# Patient Record
Sex: Female | Born: 1965 | Race: Black or African American | Hispanic: No | Smoking: Never smoker
Health system: Southern US, Community
[De-identification: ages and names within clinical notes are randomized; demographics above are authoritative.]

## PROBLEM LIST (undated history)

## (undated) DIAGNOSIS — E119 Type 2 diabetes mellitus without complications: Secondary | ICD-10-CM

## (undated) DIAGNOSIS — K219 Gastro-esophageal reflux disease without esophagitis: Secondary | ICD-10-CM

## (undated) DIAGNOSIS — I2510 Atherosclerotic heart disease of native coronary artery without angina pectoris: Secondary | ICD-10-CM

## (undated) DIAGNOSIS — E785 Hyperlipidemia, unspecified: Secondary | ICD-10-CM

## (undated) DIAGNOSIS — F32A Depression, unspecified: Secondary | ICD-10-CM

## (undated) DIAGNOSIS — I219 Acute myocardial infarction, unspecified: Secondary | ICD-10-CM

## (undated) DIAGNOSIS — I509 Heart failure, unspecified: Secondary | ICD-10-CM

## (undated) DIAGNOSIS — I10 Essential (primary) hypertension: Secondary | ICD-10-CM

## (undated) HISTORY — PX: APPENDECTOMY: SHX54

## (undated) HISTORY — DX: Atherosclerotic heart disease of native coronary artery without angina pectoris: I25.10

## (undated) HISTORY — PX: OVARIAN CYST REMOVAL: SHX89

## (undated) HISTORY — DX: Gastro-esophageal reflux disease without esophagitis: K21.9

## (undated) HISTORY — DX: Heart failure, unspecified: I50.9

## (undated) HISTORY — PX: ABDOMINAL HYSTERECTOMY: SHX81

## (undated) HISTORY — PX: CORONARY ARTERY BYPASS GRAFT: SHX141

## (undated) HISTORY — PX: MYOMECTOMY: SHX85

---

## 2010-01-04 ENCOUNTER — Emergency Department: Payer: Self-pay | Admitting: Emergency Medicine

## 2014-03-01 ENCOUNTER — Ambulatory Visit: Payer: Self-pay | Admitting: Orthopedic Surgery

## 2014-09-12 ENCOUNTER — Other Ambulatory Visit: Payer: Self-pay | Admitting: Obstetrics & Gynecology

## 2014-09-12 DIAGNOSIS — Z1231 Encounter for screening mammogram for malignant neoplasm of breast: Secondary | ICD-10-CM

## 2014-09-13 ENCOUNTER — Ambulatory Visit
Admission: RE | Admit: 2014-09-13 | Discharge: 2014-09-13 | Disposition: A | Discharge status: Home / Self Care | Payer: BLUE CROSS/BLUE SHIELD | Source: Ambulatory Visit | Attending: Obstetrics & Gynecology | Admitting: Obstetrics & Gynecology

## 2014-09-13 DIAGNOSIS — Z1231 Encounter for screening mammogram for malignant neoplasm of breast: Secondary | ICD-10-CM | POA: Diagnosis not present

## 2016-08-29 ENCOUNTER — Other Ambulatory Visit: Payer: Self-pay | Admitting: Family Medicine

## 2016-08-29 DIAGNOSIS — Z1231 Encounter for screening mammogram for malignant neoplasm of breast: Secondary | ICD-10-CM

## 2016-09-12 ENCOUNTER — Ambulatory Visit
Admission: RE | Admit: 2016-09-12 | Discharge: 2016-09-12 | Disposition: A | Discharge status: Home / Self Care | Payer: BLUE CROSS/BLUE SHIELD | Source: Ambulatory Visit | Attending: Family Medicine | Admitting: Family Medicine

## 2016-09-12 DIAGNOSIS — Z1231 Encounter for screening mammogram for malignant neoplasm of breast: Secondary | ICD-10-CM | POA: Insufficient documentation

## 2017-01-25 ENCOUNTER — Inpatient Hospital Stay
Admission: EM | Admit: 2017-01-25 | Discharge: 2017-01-30 | DRG: 246 | Disposition: A | Discharge status: Home / Self Care | Payer: BLUE CROSS/BLUE SHIELD | Attending: Internal Medicine | Admitting: Internal Medicine

## 2017-01-25 ENCOUNTER — Encounter
Admission: EM | Disposition: A | Discharge status: Home / Self Care | Payer: Self-pay | Source: Home / Self Care | Attending: Internal Medicine

## 2017-01-25 ENCOUNTER — Other Ambulatory Visit: Payer: Self-pay

## 2017-01-25 ENCOUNTER — Emergency Department: Payer: BLUE CROSS/BLUE SHIELD

## 2017-01-25 DIAGNOSIS — Z8249 Family history of ischemic heart disease and other diseases of the circulatory system: Secondary | ICD-10-CM

## 2017-01-25 DIAGNOSIS — E8770 Fluid overload, unspecified: Secondary | ICD-10-CM | POA: Diagnosis not present

## 2017-01-25 DIAGNOSIS — I2510 Atherosclerotic heart disease of native coronary artery without angina pectoris: Secondary | ICD-10-CM | POA: Diagnosis not present

## 2017-01-25 DIAGNOSIS — R9431 Abnormal electrocardiogram [ECG] [EKG]: Secondary | ICD-10-CM | POA: Diagnosis not present

## 2017-01-25 DIAGNOSIS — I2121 ST elevation (STEMI) myocardial infarction involving left circumflex coronary artery: Principal | ICD-10-CM

## 2017-01-25 DIAGNOSIS — R0602 Shortness of breath: Secondary | ICD-10-CM

## 2017-01-25 DIAGNOSIS — J209 Acute bronchitis, unspecified: Secondary | ICD-10-CM | POA: Diagnosis present

## 2017-01-25 DIAGNOSIS — R079 Chest pain, unspecified: Secondary | ICD-10-CM

## 2017-01-25 DIAGNOSIS — I213 ST elevation (STEMI) myocardial infarction of unspecified site: Secondary | ICD-10-CM | POA: Diagnosis not present

## 2017-01-25 DIAGNOSIS — I471 Supraventricular tachycardia: Secondary | ICD-10-CM | POA: Diagnosis present

## 2017-01-25 DIAGNOSIS — J9600 Acute respiratory failure, unspecified whether with hypoxia or hypercapnia: Secondary | ICD-10-CM | POA: Diagnosis not present

## 2017-01-25 DIAGNOSIS — E785 Hyperlipidemia, unspecified: Secondary | ICD-10-CM

## 2017-01-25 DIAGNOSIS — I25110 Atherosclerotic heart disease of native coronary artery with unstable angina pectoris: Secondary | ICD-10-CM | POA: Diagnosis present

## 2017-01-25 DIAGNOSIS — Z7982 Long term (current) use of aspirin: Secondary | ICD-10-CM

## 2017-01-25 DIAGNOSIS — R7301 Impaired fasting glucose: Secondary | ICD-10-CM | POA: Diagnosis present

## 2017-01-25 DIAGNOSIS — R001 Bradycardia, unspecified: Secondary | ICD-10-CM | POA: Diagnosis present

## 2017-01-25 DIAGNOSIS — I959 Hypotension, unspecified: Secondary | ICD-10-CM | POA: Diagnosis not present

## 2017-01-25 DIAGNOSIS — R059 Cough, unspecified: Secondary | ICD-10-CM

## 2017-01-25 DIAGNOSIS — Z79899 Other long term (current) drug therapy: Secondary | ICD-10-CM

## 2017-01-25 DIAGNOSIS — R05 Cough: Secondary | ICD-10-CM

## 2017-01-25 DIAGNOSIS — I10 Essential (primary) hypertension: Secondary | ICD-10-CM

## 2017-01-25 DIAGNOSIS — I200 Unstable angina: Secondary | ICD-10-CM

## 2017-01-25 HISTORY — DX: Hyperlipidemia, unspecified: E78.5

## 2017-01-25 HISTORY — PX: INTRAVASCULAR ULTRASOUND/IVUS: CATH118244

## 2017-01-25 HISTORY — PX: LEFT HEART CATH AND CORONARY ANGIOGRAPHY: CATH118249

## 2017-01-25 HISTORY — PX: CORONARY/GRAFT ACUTE MI REVASCULARIZATION: CATH118305

## 2017-01-25 HISTORY — DX: Essential (primary) hypertension: I10

## 2017-01-25 LAB — BASIC METABOLIC PANEL
Anion gap: 8 (ref 5–15)
BUN: 10 mg/dL (ref 6–20)
CALCIUM: 10.5 mg/dL — AB (ref 8.9–10.3)
CHLORIDE: 105 mmol/L (ref 101–111)
CO2: 24 mmol/L (ref 22–32)
CREATININE: 0.8 mg/dL (ref 0.44–1.00)
GFR calc non Af Amer: >60 mL/min (ref >60)
GLUCOSE: 138 mg/dL — AB (ref 65–99)
Potassium: 3.8 mmol/L (ref 3.5–5.1)
Sodium: 137 mmol/L (ref 135–145)

## 2017-01-25 LAB — CBC
HCT: 38.8 % (ref 35.0–47.0)
Hemoglobin: 12.5 g/dL (ref 12.0–16.0)
MCH: 25.6 pg — AB (ref 26.0–34.0)
MCHC: 32.3 g/dL (ref 32.0–36.0)
MCV: 79.1 fL — AB (ref 80.0–100.0)
PLATELETS: 245 10*3/uL (ref 150–440)
RBC: 4.9 MIL/uL (ref 3.80–5.20)
RDW: 14.4 % (ref 11.5–14.5)
WBC: 7.7 10*3/uL (ref 3.6–11.0)

## 2017-01-25 LAB — TROPONIN I
TROPONIN I: 0.14 ng/mL — AB (ref <0.03)
TROPONIN I: 0.55 ng/mL — AB (ref <0.03)

## 2017-01-25 LAB — PROTIME-INR
INR: 1
Prothrombin Time: 13.1 seconds (ref 11.4–15.2)

## 2017-01-25 LAB — HEPARIN LEVEL (UNFRACTIONATED): Heparin Unfractionated: 0.44 IU/mL (ref 0.30–0.70)

## 2017-01-25 LAB — GLUCOSE, CAPILLARY
GLUCOSE-CAPILLARY: 113 mg/dL — AB (ref 65–99)
Glucose-Capillary: 115 mg/dL — ABNORMAL HIGH (ref 65–99)

## 2017-01-25 LAB — APTT: aPTT: 30 seconds (ref 24–36)

## 2017-01-25 LAB — HCG, QUANTITATIVE, PREGNANCY: hCG, Beta Chain, Quant, S: 1 m[IU]/mL (ref <5)

## 2017-01-25 SURGERY — LEFT HEART CATH AND CORONARY ANGIOGRAPHY
Anesthesia: Moderate Sedation

## 2017-01-25 SURGERY — CORONARY/GRAFT ACUTE MI REVASCULARIZATION

## 2017-01-25 MED ORDER — TICAGRELOR 90 MG PO TABS
ORAL_TABLET | ORAL | Status: DC | PRN
  Administered 2017-01-25: 180 mg via ORAL

## 2017-01-25 MED ORDER — ROSUVASTATIN CALCIUM 10 MG PO TABS
20.0000 mg | ORAL_TABLET | Freq: Every evening | ORAL | Status: DC
  Administered 2017-01-25: 20 mg via ORAL
  Filled 2017-01-25: qty 2

## 2017-01-25 MED ORDER — HEPARIN SODIUM (PORCINE) 5000 UNIT/ML IJ SOLN: 60.0000 [IU]/kg | Freq: Once | INTRAMUSCULAR | Status: DC

## 2017-01-25 MED ORDER — ASPIRIN 81 MG PO CHEW
324.0000 mg | CHEWABLE_TABLET | Freq: Once | ORAL | Status: AC
  Administered 2017-01-25: 324 mg via ORAL

## 2017-01-25 MED ORDER — TIROFIBAN HCL IN NACL 5-0.9 MG/100ML-% IV SOLN
INTRAVENOUS | Status: AC | PRN
  Administered 2017-01-25: 0.15 ug/kg/min via INTRAVENOUS

## 2017-01-25 MED ORDER — HEPARIN SODIUM (PORCINE) 1000 UNIT/ML IJ SOLN
INTRAMUSCULAR | Status: AC
  Filled 2017-01-25: qty 1

## 2017-01-25 MED ORDER — NOREPINEPHRINE BITARTRATE 1 MG/ML IV SOLN
INTRAVENOUS | Status: AC
  Filled 2017-01-25: qty 4

## 2017-01-25 MED ORDER — HEPARIN (PORCINE) IN NACL 100-0.45 UNIT/ML-% IJ SOLN
750.0000 [IU]/h | INTRAMUSCULAR | Status: DC
  Administered 2017-01-25: 750 [IU]/h via INTRAVENOUS
  Filled 2017-01-25: qty 250

## 2017-01-25 MED ORDER — NITROGLYCERIN 0.4 MG SL SUBL
0.4000 mg | SUBLINGUAL_TABLET | SUBLINGUAL | Status: DC | PRN
  Administered 2017-01-25: 0.4 mg via SUBLINGUAL
  Filled 2017-01-25: qty 1

## 2017-01-25 MED ORDER — HEPARIN SODIUM (PORCINE) 1000 UNIT/ML IJ SOLN
INTRAMUSCULAR | Status: DC | PRN
  Administered 2017-01-25: 4000 [IU] via INTRAVENOUS
  Administered 2017-01-25: 6000 [IU] via INTRAVENOUS

## 2017-01-25 MED ORDER — ASPIRIN 81 MG PO CHEW: 324.0000 mg | CHEWABLE_TABLET | Freq: Once | ORAL | Status: DC

## 2017-01-25 MED ORDER — ASPIRIN EC 81 MG PO TBEC
81.0000 mg | DELAYED_RELEASE_TABLET | Freq: Every day | ORAL | Status: DC
  Administered 2017-01-26 – 2017-01-30 (×5): 81 mg via ORAL
  Filled 2017-01-25 (×6): qty 1

## 2017-01-25 MED ORDER — BISACODYL 10 MG RE SUPP: 10.0000 mg | Freq: Every day | RECTAL | Status: DC | PRN

## 2017-01-25 MED ORDER — VERAPAMIL HCL 2.5 MG/ML IV SOLN
INTRAVENOUS | Status: AC
  Filled 2017-01-25: qty 2

## 2017-01-25 MED ORDER — PANTOPRAZOLE SODIUM 40 MG IV SOLR
40.0000 mg | Freq: Two times a day (BID) | INTRAVENOUS | Status: DC
  Administered 2017-01-25: 40 mg via INTRAVENOUS
  Filled 2017-01-25: qty 40

## 2017-01-25 MED ORDER — SODIUM CHLORIDE 0.9 % IV SOLN: INTRAVENOUS | Status: DC

## 2017-01-25 MED ORDER — NITROGLYCERIN 2 % TD OINT
1.0000 [in_us] | TOPICAL_OINTMENT | Freq: Four times a day (QID) | TRANSDERMAL | Status: DC
  Administered 2017-01-25: 1 [in_us] via TOPICAL
  Filled 2017-01-25 (×2): qty 1

## 2017-01-25 MED ORDER — NITROGLYCERIN 0.4 MG SL SUBL
0.4000 mg | SUBLINGUAL_TABLET | SUBLINGUAL | Status: DC | PRN
Start: 2017-01-25 — End: 2017-01-25
  Administered 2017-01-25: 0.4 mg via SUBLINGUAL
  Filled 2017-01-25: qty 1

## 2017-01-25 MED ORDER — ASPIRIN 81 MG PO CHEW: 81.0000 mg | CHEWABLE_TABLET | ORAL | Status: DC

## 2017-01-25 MED ORDER — HEPARIN BOLUS VIA INFUSION
3700.0000 [IU] | Freq: Once | INTRAVENOUS | Status: AC
  Administered 2017-01-25: 3700 [IU] via INTRAVENOUS
  Filled 2017-01-25: qty 3700

## 2017-01-25 MED ORDER — TIROFIBAN HCL IN NACL 5-0.9 MG/100ML-% IV SOLN
INTRAVENOUS | Status: AC
  Filled 2017-01-25: qty 100

## 2017-01-25 MED ORDER — ASPIRIN 81 MG PO CHEW
CHEWABLE_TABLET | ORAL | Status: AC
  Administered 2017-01-25: 324 mg via ORAL
  Filled 2017-01-25: qty 4

## 2017-01-25 MED ORDER — NITROGLYCERIN IN D5W 200-5 MCG/ML-% IV SOLN
0.0000 ug/min | INTRAVENOUS | Status: DC
  Administered 2017-01-25: 5 ug/min via INTRAVENOUS
  Administered 2017-01-25: 25 ug/min via INTRAVENOUS
  Administered 2017-01-25: 10 ug/min via INTRAVENOUS
  Administered 2017-01-25: 15 ug/min via INTRAVENOUS
  Administered 2017-01-25: 20 ug/min via INTRAVENOUS
  Filled 2017-01-25: qty 250

## 2017-01-25 MED ORDER — LORATADINE 10 MG PO TABS
10.0000 mg | ORAL_TABLET | Freq: Every day | ORAL | Status: DC
  Administered 2017-01-26 – 2017-01-30 (×5): 10 mg via ORAL
  Filled 2017-01-25 (×5): qty 1

## 2017-01-25 MED ORDER — SODIUM CHLORIDE 0.9 % IV SOLN
INTRAVENOUS | Status: DC
  Administered 2017-01-25: 22:00:00 via INTRAVENOUS

## 2017-01-25 MED ORDER — NITROGLYCERIN 2 % TD OINT
1.0000 [in_us] | TOPICAL_OINTMENT | Freq: Once | TRANSDERMAL | Status: AC
  Administered 2017-01-25: 1 [in_us] via TOPICAL
  Filled 2017-01-25: qty 1

## 2017-01-25 MED ORDER — FENTANYL CITRATE (PF) 100 MCG/2ML IJ SOLN
50.0000 ug | INTRAMUSCULAR | Status: DC | PRN
Start: 2017-01-25 — End: 2017-01-25
  Administered 2017-01-25: 50 ug via INTRAVENOUS
  Filled 2017-01-25: qty 2

## 2017-01-25 MED ORDER — DOCUSATE SODIUM 100 MG PO CAPS
100.0000 mg | ORAL_CAPSULE | Freq: Two times a day (BID) | ORAL | Status: DC
  Administered 2017-01-26 – 2017-01-30 (×9): 100 mg via ORAL
  Filled 2017-01-25 (×9): qty 1

## 2017-01-25 MED ORDER — TIROFIBAN (AGGRASTAT) BOLUS VIA INFUSION
INTRAVENOUS | Status: DC | PRN
  Administered 2017-01-25: 1615 ug via INTRAVENOUS

## 2017-01-25 MED ORDER — MORPHINE SULFATE (PF) 2 MG/ML IV SOLN
2.0000 mg | INTRAVENOUS | Status: DC | PRN
  Administered 2017-01-25: 2 mg via INTRAVENOUS
  Filled 2017-01-25: qty 1

## 2017-01-25 MED ORDER — SODIUM CHLORIDE 0.9 % IV BOLUS (SEPSIS)
500.0000 mL | Freq: Once | INTRAVENOUS | Status: AC
  Administered 2017-01-25: 500 mL via INTRAVENOUS

## 2017-01-25 MED ORDER — ONDANSETRON HCL 4 MG PO TABS: 4.0000 mg | ORAL_TABLET | Freq: Four times a day (QID) | ORAL | Status: DC | PRN

## 2017-01-25 MED ORDER — ONDANSETRON HCL 4 MG/2ML IJ SOLN
4.0000 mg | Freq: Four times a day (QID) | INTRAMUSCULAR | Status: DC | PRN
  Administered 2017-01-25 – 2017-01-26 (×3): 4 mg via INTRAVENOUS
  Filled 2017-01-25 (×4): qty 2

## 2017-01-25 MED ORDER — TICAGRELOR 90 MG PO TABS
ORAL_TABLET | ORAL | Status: AC
  Filled 2017-01-25: qty 2

## 2017-01-25 MED ORDER — ALUM & MAG HYDROXIDE-SIMETH 200-200-20 MG/5ML PO SUSP
30.0000 mL | ORAL | Status: DC | PRN
  Filled 2017-01-25: qty 30

## 2017-01-25 MED ORDER — METOPROLOL TARTRATE 25 MG PO TABS
25.0000 mg | ORAL_TABLET | Freq: Two times a day (BID) | ORAL | Status: DC
  Administered 2017-01-25: 25 mg via ORAL
  Filled 2017-01-25: qty 1

## 2017-01-25 MED ORDER — NITROGLYCERIN 5 MG/ML IV SOLN
INTRAVENOUS | Status: AC
  Filled 2017-01-25: qty 10

## 2017-01-25 MED ORDER — INSULIN ASPART 100 UNIT/ML ~~LOC~~ SOLN
0.0000 [IU] | Freq: Three times a day (TID) | SUBCUTANEOUS | Status: DC
  Administered 2017-01-26 – 2017-01-28 (×6): 2 [IU] via SUBCUTANEOUS
  Administered 2017-01-28 – 2017-01-29 (×4): 1 [IU] via SUBCUTANEOUS
  Filled 2017-01-25 (×10): qty 1

## 2017-01-25 MED ORDER — ACETAMINOPHEN 325 MG PO TABS
650.0000 mg | ORAL_TABLET | Freq: Four times a day (QID) | ORAL | Status: DC | PRN
  Administered 2017-01-25 – 2017-01-27 (×2): 650 mg via ORAL
  Filled 2017-01-25 (×2): qty 2

## 2017-01-25 MED ORDER — ATROPINE SULFATE 1 MG/10ML IJ SOSY
PREFILLED_SYRINGE | INTRAMUSCULAR | Status: DC | PRN
  Administered 2017-01-25: 0.5 mg via INTRAVENOUS

## 2017-01-25 MED ORDER — NITROGLYCERIN 1 MG/10 ML FOR IR/CATH LAB
INTRA_ARTERIAL | Status: DC | PRN
  Administered 2017-01-25 (×2): 100 ug via INTRACORONARY

## 2017-01-25 MED ORDER — LOSARTAN POTASSIUM 50 MG PO TABS: 50.0000 mg | ORAL_TABLET | Freq: Every day | ORAL | Status: DC

## 2017-01-25 MED ORDER — ACETAMINOPHEN 650 MG RE SUPP: 650.0000 mg | Freq: Four times a day (QID) | RECTAL | Status: DC | PRN

## 2017-01-25 MED ORDER — ATROPINE SULFATE 1 MG/10ML IJ SOSY
PREFILLED_SYRINGE | INTRAMUSCULAR | Status: AC
  Filled 2017-01-25: qty 10

## 2017-01-25 SURGICAL SUPPLY — 24 items
BALLN MINITREK RX 2.0X12 (BALLOONS) ×3
BALLN ~~LOC~~ EUPHORA RX 3.0X15 (BALLOONS) ×3
BALLN ~~LOC~~ TREK RX 3.5X8 (BALLOONS) ×3
BALLOON MINITREK RX 2.0X12 (BALLOONS) ×1 IMPLANT
BALLOON ~~LOC~~ EUPHORA RX 3.0X15 (BALLOONS) ×1 IMPLANT
BALLOON ~~LOC~~ TREK RX 3.5X8 (BALLOONS) ×1 IMPLANT
CANNULA 5F STIFF (CANNULA) ×3 IMPLANT
CATH EAGLE EYE PLAT IMAGING (CATHETERS) ×3 IMPLANT
CATH INFINITI 5FR AL1 (CATHETERS) ×3 IMPLANT
CATH INFINITI 5FR ANG PIGTAIL (CATHETERS) ×3 IMPLANT
CATH INFINITI JR4 5F (CATHETERS) ×3 IMPLANT
CATH VISTA GUIDE 6FR JR4 (CATHETERS) IMPLANT
CATH VISTA GUIDE 6FR XB3.5 (CATHETERS) ×3 IMPLANT
DEVICE CLOSURE MYNXGRIP 6/7F (Vascular Products) ×3 IMPLANT
DEVICE INFLAT 30 PLUS (MISCELLANEOUS) ×3 IMPLANT
GLIDESHEATH SLEND SS 6F .021 (SHEATH) IMPLANT
GUIDEWIRE 3MM J TIP .035 145 (WIRE) ×3 IMPLANT
KIT MANI 3VAL PERCEP (MISCELLANEOUS) ×3 IMPLANT
NEEDLE PERC 18GX7CM (NEEDLE) IMPLANT
PACK CARDIAC CATH (CUSTOM PROCEDURE TRAY) ×3 IMPLANT
SHEATH AVANTI 6FR X 11CM (SHEATH) ×3 IMPLANT
STENT SIERRA 2.50 X 23 MM (Permanent Stent) ×3 IMPLANT
WIRE ROSEN-J .035X260CM (WIRE) IMPLANT
WIRE RUNTHROUGH .014X180CM (WIRE) ×3 IMPLANT

## 2017-01-25 NOTE — H&P (Signed)
History and Physical    Jenna Boyer HYW:[id number] DOB: November 21, 1965 DOA: 01/25/2017  Referring physician: Dr. Quentin Cornwall PCP: No primary care provider on file.  Specialists: none  Chief Complaint: chest pain  HPI: Jenna Boyer is a 51 y.o. female has a past medical history significant for HTN and HLD  Now with new onset CP and SOB with minimal exertion. EKG abnormal in ER. Pain relieved with nitrates. 1st troponin normal. She is now admitted. Denies cardiac hx. No FH of CAD. Does not smoke. Not on OCP's. Currently pain-free  Review of Systems: The patient denies anorexia, fever, weight loss,, vision loss, decreased hearing, hoarseness,  syncope, peripheral edema, balance deficits, hemoptysis, abdominal pain, melena, hematochezia, severe indigestion/heartburn, hematuria, incontinence, genital sores, muscle weakness, suspicious skin lesions, transient blindness, difficulty walking, depression, unusual weight change, abnormal bleeding, enlarged lymph nodes, angioedema, and breast masses.   Past Medical History:  Diagnosis Date  . Hyperlipemia   . Hypertension    Past Surgical History:  Procedure Laterality Date  . ABDOMINAL HYSTERECTOMY    . APPENDECTOMY     Social History:  reports that  has never smoked. she has never used smokeless tobacco. She reports that she does not drink alcohol or use drugs.  No Known Allergies  Family History  Problem Relation Age of Onset  . Breast cancer Maternal Grandmother     Prior to Admission medications   Medication Sig Start Date End Date Taking? Authorizing Provider  aspirin EC 81 MG tablet Take 81 mg by mouth daily.   Yes [provider]  fexofenadine (ALLEGRA) 180 MG tablet Take 180 mg by mouth daily.   Yes [provider]  losartan (COZAAR) 100 MG tablet Take 100 mg by mouth daily. 12/06/16  Yes [provider]  rosuvastatin (CRESTOR) 20 MG tablet Take 20 mg by mouth every evening. 12/22/16  Yes [provider]   Physical Exam: Vitals:   01/25/17 1241 01/25/17 1242  Pulse: (!) 102   Resp: 18   Temp: 98 F (36.7 C)   TempSrc: Oral   SpO2: 100%   Weight:  65.8 kg (145 lb)  Height:  5\' 1"  (1.549 m)     General:  No apparent distress, WDWN, [place]/AT  Eyes: PERRL, EOMI, no scleral icterus, conjunctiva clear  ENT: moist oropharynx without exudate, TM's benign, dentition good  Neck: supple, no lymphadenopathy. No bruits or thyromegaly  Cardiovascular: regular rate without MRG; 2+ peripheral pulses, no JVD, no peripheral edema  Respiratory: CTA biL, good air movement without wheezing, rhonchi or crackled. Respiratory effort normal  Abdomen: soft, non tender to palpation, positive bowel sounds, no guarding, no rebound  Skin: no rashes or lesions  Musculoskeletal: normal bulk and tone, no joint swelling  Psychiatric: normal mood and affect, A&OX3  Neurologic: CN 2-12 grossly intact, Motor strength 5/5 in all 4 groups with symmetric DTR's and non-focal sensory exam  Labs on Admission:  Basic Metabolic Panel: Recent Labs  Lab 01/25/17 1242  NA 137  K 3.8  CL 105  CO2 24  GLUCOSE 138*  BUN 10  CREATININE 0.80  CALCIUM 10.5*   Liver Function Tests: No results for input(s): AST, ALT, ALKPHOS, BILITOT, PROT, ALBUMIN in the last 168 hours. No results for input(s): LIPASE, AMYLASE in the last 168 hours. No results for input(s): AMMONIA in the last 168 hours. CBC: Recent Labs  Lab 01/25/17 1242  WBC 7.7  HGB 12.5  HCT 38.8  MCV 79.1*  PLT 245  Cardiac Enzymes: Recent Labs  Lab 01/25/17 1242  TROPONINI <0.03    BNP (last 3 results) No results for input(s): BNP in the last 8760 hours.  ProBNP (last 3 results) No results for input(s): PROBNP in the last 8760 hours.  CBG: No results for input(s): GLUCAP in the last 168 hours.  Radiological Exams on Admission: Dg Chest Portable 1 View  Result Date: 01/25/2017 CLINICAL DATA:  Chest pain and shortness  of Breath EXAM: PORTABLE CHEST 1 VIEW COMPARISON:  None. FINDINGS: The heart size and mediastinal contours are within normal limits. Both lungs are clear. The visualized skeletal structures are unremarkable. IMPRESSION: No active disease. Electronically Signed   By: Kerby Moors M.D.   On: 01/25/2017 13:16    EKG: Independently reviewed.  Assessment/Plan Principal Problem:   Unstable angina (HCC) Active Problems:   HTN (hypertension)   HLD (hyperlipidemia)   SOB (shortness of breath)   Will observe on telemetry with IV Heparin and NTP. Follow enzymes. Echo ordered. Consult Cardiology. Repeat labs in AM  Diet: clear liquids Fluids: NS@50  DVT Prophylaxis: IV Heparin  Code Status: FULL  Family Communication: none  Disposition Plan: home  Time spent: 50 min

## 2017-01-25 NOTE — Progress Notes (Signed)
CRITICAL VALUE ALERT  Critical Value:  Troponin 0.14  Date & Time Notied: 01/25/17 1615   Provider Notified: Dr Doy Hutching  Orders Received/Actions taken: none

## 2017-01-25 NOTE — Progress Notes (Signed)
Patient has developed recurrent chest pain with Now st elevation in lateral and reciprocal changes in anterior leads. Spoke to Dr. Jannifer Franklin and agree in starting STEMI protocol. Advise plavix 300 mg now and activate STEMI protocol.Dr. Jannifer Franklin has already contacted Dr.End and was on the phone while I called. I agree with taking to Cath lab now.

## 2017-01-25 NOTE — Progress Notes (Signed)
ANTICOAGULATION CONSULT NOTE - Initial Consult  Pharmacy Consult for heparin Indication: chest pain/ACS  No Known Allergies  Patient Measurements: Height: 5\' 1"  (154.9 cm) Weight: 145 lb (65.8 kg) IBW/kg (Calculated) : 47.8 Heparin Dosing Weight:   Vital Signs: Temp: 98 F (36.7 C) (12/30 1241) Temp Source: Oral (12/30 1241) Pulse Rate: 102 (12/30 1241)  Labs: Recent Labs    01/25/17 1242  HGB 12.5  HCT 38.8  PLT 245  LABPROT 13.1  INR 1.00  CREATININE 0.80  TROPONINI <0.03    Estimated Creatinine Clearance: 73.9 mL/min (by C-G formula based on SCr of 0.8 mg/dL).   Medical History: Past Medical History:  Diagnosis Date  . Hyperlipemia   . Hypertension     Medications:  Infusions:  . sodium chloride    . heparin      Assessment: 51 yof cc chest pain/SOB with exertion. Pharmacy consulted to dose heparin for ACS. RN reports no OAC PTA but we will f/u PTA when available.  Goal of Therapy:  Heparin level 0.3-0.7 units/ml Monitor platelets by anticoagulation protocol: Yes   Plan:  Give 3700 units bolus x 1 Start heparin infusion at 750 units/hr Check anti-Xa level in 6 hours and daily while on heparin Continue to monitor H&H and platelets  Laural Benes, Pharm.D., BCPS Clinical Pharmacist 01/25/2017,1:17 PM

## 2017-01-25 NOTE — Progress Notes (Signed)
51 yo bf admitted to room 245 from ED with Chest pain.  A&O x3, gait steady.  No distress on ra.  Cardiac monitor applied and verified with Salley Scarlet. Pt states no chest pain, just some burning in her epigastric area.  Complaining of headache, Tylenol 650mg  po given, will monitor.  Lungs clear bil.  SL rt ac flushes well.  Heparin gtt infusing lt ac.  Skin intact. Oriented to room and surroundings, POC reviewed with pt.  CB in reach, SR up x 2.

## 2017-01-25 NOTE — ED Triage Notes (Signed)
Pt brought in by POV.  Pt states that she has central chest pain that started in the shower at approx 730 am.  Pt is [facility] with exertion at this time.  Pt is A&Ox4.

## 2017-01-25 NOTE — ED Notes (Signed)
Pt up to restroom at this time

## 2017-01-25 NOTE — Progress Notes (Addendum)
Progress Note  Patient Name: Jenna Boyer Date of Encounter: 01/25/2017  Primary Cardiologist: No primary care provider on file.   Subjective   Patient with recurrent severe substernal chest pain. Repeat EKG this evening with new lateral ST elevation and depressions in anterior leads. Pt seen by Dr. Humphrey Rolls in ED but not immediately available this evening for consultation.  Inpatient Medications    Scheduled Meds: . [START ON 01/26/2017] aspirin  81 mg Oral Pre-Cath  . [START ON 01/26/2017] aspirin EC  81 mg Oral Daily  . docusate sodium  100 mg Oral BID  . insulin aspart  0-9 Units Subcutaneous TID WC  . [START ON 01/26/2017] loratadine  10 mg Oral Daily  . [START ON 01/26/2017] losartan  50 mg Oral Daily  . metoprolol tartrate  25 mg Oral BID  . pantoprazole (PROTONIX) IV  40 mg Intravenous Q12H  . rosuvastatin  20 mg Oral QPM   Continuous Infusions: . sodium chloride 100 mL/hr at 01/25/17 2223  . heparin Stopped (01/25/17 2211)  . nitroGLYCERIN 20 mcg/min (01/25/17 2219)   PRN Meds: acetaminophen **OR** acetaminophen, alum & mag hydroxide-simeth, bisacodyl, morphine injection, ondansetron **OR** ondansetron (ZOFRAN) IV   Vital Signs    Vitals:   01/25/17 2151 01/25/17 2156 01/25/17 2201 01/25/17 2206  BP: 105/63 96/69 104/62 94/65  Pulse: 73 70 74 76  Resp:      Temp:      TempSrc:      SpO2: 100% 100% 100% 100%  Weight:      Height:        Intake/Output Summary (Last 24 hours) at 01/25/2017 2225 Last data filed at 01/25/2017 1853 Gross per 24 hour  Intake 633.13 ml  Output -  Net 633.13 ml   Filed Weights   01/25/17 1242 01/25/17 1513  Weight: 65.8 kg (145 lb) 64.6 kg (142 lb 6.4 oz)    Telemetry    NSR, sinus brady, sinus tach, and PVCs - Personally Reviewed  ECG    Sinus bradycardia with 1 mm ST elevation in I and aVL as well as depressions in V1-V4 - Personally Reviewed  Physical Exam   GEN: Uncomoftable-appearing.  Cardiac: Bradycardiac  but regular; no murmurs, rubs, or gallops.  Respiratory: Clear to auscultation bilaterally. GI: Soft, nontender, non-distended  MS: No edema; No deformity. Neuro:  Nonfocal  Psych: Normal affect   Labs    Chemistry Recent Labs  Lab 01/25/17 1242  NA 137  K 3.8  CL 105  CO2 24  GLUCOSE 138*  BUN 10  CREATININE 0.80  CALCIUM 10.5*  GFRNONAA >60  GFRAA >60  ANIONGAP 8     Hematology Recent Labs  Lab 01/25/17 1242  WBC 7.7  RBC 4.90  HGB 12.5  HCT 38.8  MCV 79.1*  MCH 25.6*  MCHC 32.3  RDW 14.4  PLT 245    Cardiac Enzymes Recent Labs  Lab 01/25/17 1242 01/25/17 1525 01/25/17 2031  TROPONINI <0.03 0.14* 0.55*   No results for input(s): TROPIPOC in the last 168 hours.   BNPNo results for input(s): BNP, PROBNP in the last 168 hours.   DDimer No results for input(s): DDIMER in the last 168 hours.   Radiology    Dg Chest Portable 1 View  Result Date: 01/25/2017 CLINICAL DATA:  Chest pain and shortness of Breath EXAM: PORTABLE CHEST 1 VIEW COMPARISON:  None. FINDINGS: The heart size and mediastinal contours are within normal limits. Both lungs are clear. The visualized skeletal structures  are unremarkable. IMPRESSION: No active disease. Electronically Signed   By: Kerby Moors M.D.   On: 01/25/2017 13:16    Cardiac Studies   None  Patient Profile     51 y.o. female with h/o HTN, admitted with chest pain and rising troponin. She has had recurrent, severe chest pain this evening with dynamic EKG changes now showing findings consistent with posterolateral STEMI.  Assessment & Plan    STEMI Recurrent chest pain this evening with dynamic EKG changes. CP refractory to initiation of NTG infusion.  Plan for emergent LHC with possible PCI tonight. I have reviewed the risks, indications, and alternatives to cardiac catheterization, possible angioplasty, and stenting with the patient. Risks include but are not limited to bleeding, infection, vascular injury,  stroke, myocardial infection, arrhythmia, kidney injury, radiation-related injury in the case of prolonged fluoroscopy use, emergency cardiac surgery, and death. The patient understands the risks of serious complication is 1-2 in 3976 with diagnostic cardiac cath and 1-2% or less with angioplasty/stenting.  Further recommendations to be made following catheterization.  Critical care time: ~40 minutes were spent assessing the patient, reviewing her records, and coordinating emergent cardiac catheterization.  For questions or updates, please contact [place] Please consult [email or web address] for contact info under Cardiology/STEMI.      Signed, Nelva Bush, MD  01/25/2017, 10:25 PM

## 2017-01-25 NOTE — Progress Notes (Addendum)
Called by nursing to assess patient for recurrence of chest pain.  Patient admitted for ACS and on heparin gtt with cath scheduled for tomorrow AM.  Tonight developed recurrent substernal chest pressure/burning with ST elevation in lead I and ST depression in leads V1-V3.  Starting nitro gtt, though patient's BP is low.  Will run fluid bolus concurrently to elevate BP to support for nitro gtt.  Attempting to contact Dr Humphrey Rolls from cardiology who saw the patient today.  Will rpreat EKG after nitro gtt is initiated.  If unable to contact Dr Humphrey Rolls will call on call STEMI cardiologist for evaluation for potential catheterization tonight.  Spoke with Dr Humphrey Rolls who agrees upon reviewing EKG that patient has dynamic changes consistent with STEMI.  Spoke with Dr End, on call for STEMI, and he agreed to proceed with emergent catheterization.  Jacqulyn Bath [facility] 01/25/2017, 9:29 PM

## 2017-01-25 NOTE — Consult Note (Signed)
Jenna Boyer is a 51 y.o. female  [id number]  Primary Cardiologist: [place]  Reason for Consultation: ACS  HPI: [id number] presented with chest pain sincce 6AM and decribed as pressure type lasting until 1 PM after IV nito/asp/plavix and heparin given as per reecomended by me in ER. No further chest pain but has some tightness .  Review of Systems: No orthopnea   Past Medical History:  Diagnosis Date  . Hyperlipemia   . Hypertension     Medications Prior to Admission  Medication Sig Dispense Refill  . aspirin EC 81 MG tablet Take 81 mg by mouth daily.    . fexofenadine (ALLEGRA) 180 MG tablet Take 180 mg by mouth daily.    Marland Kitchen losartan (COZAAR) 100 MG tablet Take 100 mg by mouth daily.  3  . rosuvastatin (CRESTOR) 20 MG tablet Take 20 mg by mouth every evening.  3     . [START ON 01/26/2017] aspirin EC  81 mg Oral Daily  . docusate sodium  100 mg Oral BID  . insulin aspart  0-9 Units Subcutaneous TID WC  . [START ON 01/26/2017] loratadine  10 mg Oral Daily  . [START ON 01/26/2017] losartan  50 mg Oral Daily  . metoprolol tartrate  25 mg Oral BID  . nitroGLYCERIN  1 inch Topical Q6H  . pantoprazole (PROTONIX) IV  40 mg Intravenous Q12H  . rosuvastatin  20 mg Oral QPM    Infusions: . sodium chloride    . heparin 750 Units/hr (01/25/17 1345)    No Known Allergies  Social History   Socioeconomic History  . Marital status: Widowed    Spouse name: Not on file  . Number of children: Not on file  . Years of education: Not on file  . Highest education level: Not on file  Social Needs  . Financial resource strain: Not on file  . Food insecurity - worry: Not on file  . Food insecurity - inability: Not on file  . Transportation needs - medical: Not on file  . Transportation needs - non-medical: Not on file  Occupational History  . Not on file  Tobacco Use  . Smoking status: Never Smoker  . Smokeless tobacco: Never Used  Substance and Sexual Activity  . Alcohol  use: No    Frequency: Never  . Drug use: No  . Sexual activity: Not on file  Other Topics Concern  . Not on file  Social History Narrative  . Not on file    Family History  Problem Relation Age of Onset  . Breast cancer Maternal Grandmother     PHYSICAL EXAM: Vitals:   01/25/17 1514 01/25/17 1522  BP:    Pulse:    Resp:    Temp:  98.3 F (36.8 C)  SpO2: 100%      Intake/Output Summary (Last 24 hours) at 01/25/2017 1523 Last data filed at 01/25/2017 1321 Gross per 24 hour  Intake 500 ml  Output -  Net 500 ml    General:  Well appearing. No respiratory difficulty HEENT: normal Neck: supple. no JVD. Carotids 2+ bilat; no bruits. No lymphadenopathy or thryomegaly appreciated. Cor: PMI nondisplaced. Regular rate & rhythm. No rubs, gallops or murmurs. Lungs: clear Abdomen: soft, nontender, nondistended. No hepatosplenomegaly. No bruits or masses. Good bowel sounds. Extremities: no cyanosis, clubbing, rash, edema Neuro: alert & oriented x 3, cranial nerves grossly intact. moves all 4 extremities w/o difficulty. Affect pleasant.  ECG: Sinus tachycardia with inferolateral st depression  Results for orders placed or performed during the hospital encounter of 01/25/17 (from the past 24 hour(s))  Basic metabolic panel     Status: Abnormal   Collection Time: 01/25/17 12:42 PM  Result Value Ref Range   Sodium 137 135 - 145 mmol/L   Potassium 3.8 3.5 - 5.1 mmol/L   Chloride 105 101 - 111 mmol/L   CO2 24 22 - 32 mmol/L   Glucose, Bld 138 (H) 65 - 99 mg/dL   BUN 10 6 - 20 mg/dL   Creatinine, Ser 0.80 0.44 - 1.00 mg/dL   Calcium 10.5 (H) 8.9 - 10.3 mg/dL   GFR calc non Af Amer >60 >60 mL/min   GFR calc Af Amer >60 >60 mL/min   Anion gap 8 5 - 15  CBC     Status: Abnormal   Collection Time: 01/25/17 12:42 PM  Result Value Ref Range   WBC 7.7 3.6 - 11.0 K/uL   RBC 4.90 3.80 - 5.20 MIL/uL   Hemoglobin 12.5 12.0 - 16.0 g/dL   HCT 38.8 35.0 - 47.0 %   MCV 79.1 (L) 80.0 -  100.0 fL   MCH 25.6 (L) 26.0 - 34.0 pg   MCHC 32.3 32.0 - 36.0 g/dL   RDW 14.4 11.5 - 14.5 %   Platelets 245 150 - 440 K/uL  Troponin I     Status: None   Collection Time: 01/25/17 12:42 PM  Result Value Ref Range   Troponin I <0.03 <0.03 ng/mL  Protime-INR (order if Patient is taking Coumadin / Warfarin)     Status: None   Collection Time: 01/25/17 12:42 PM  Result Value Ref Range   Prothrombin Time 13.1 11.4 - 15.2 seconds   INR 1.00   hCG, quantitative, pregnancy     Status: None   Collection Time: 01/25/17 12:42 PM  Result Value Ref Range   hCG, Beta Chain, Quant, S 1 <5 mIU/mL  APTT     Status: None   Collection Time: 01/25/17 12:42 PM  Result Value Ref Range   aPTT 30 24 - 36 seconds   Dg Chest Portable 1 View  Result Date: 01/25/2017 CLINICAL DATA:  Chest pain and shortness of Breath EXAM: PORTABLE CHEST 1 VIEW COMPARISON:  None. FINDINGS: The heart size and mediastinal contours are within normal limits. Both lungs are clear. The visualized skeletal structures are unremarkable. IMPRESSION: No active disease. Electronically Signed   By: Kerby Moors M.D.   On: 01/25/2017 13:16     ASSESSMENT AND PLAN: Acute coronary syndrome with EKG changes sugesstive of ischaemia and first set troponin ok. Advise heparin/nitrates , plavix and asp and scchedualed cardiac cath for AM.  [facility]

## 2017-01-25 NOTE — Plan of Care (Signed)
  Progressing Education: Knowledge of General Education information will improve 01/25/2017 1551 - Progressing by Etheleen Nicks, RN Cardiac: Ability to achieve and maintain adequate cardiovascular perfusion will improve 01/25/2017 1551 - Progressing by Etheleen Nicks, RN Note Remains chest pain free, pt to have heart cath tomorrow   Not Applicable Clinical Measurements: Diagnostic test results will improve 11/55/2080 2233 - Not Applicable by Etheleen Nicks, RN

## 2017-01-25 NOTE — Progress Notes (Signed)
ANTICOAGULATION CONSULT NOTE - Initial Consult  Pharmacy Consult for heparin Indication: chest pain/ACS  No Known Allergies  Patient Measurements: Height: 5\' 1"  (154.9 cm) Weight: 142 lb 6.4 oz (64.6 kg) IBW/kg (Calculated) : 47.8 Heparin Dosing Weight:   Vital Signs: Temp: 98.3 F (36.8 C) (12/30 2044) Temp Source: Oral (12/30 2044) BP: 86/58 (12/30 2127) Pulse Rate: 67 (12/30 2127)  Labs: Recent Labs    01/25/17 1242 01/25/17 1525 01/25/17 2031  HGB 12.5  --   --   HCT 38.8  --   --   PLT 245  --   --   APTT 30  --   --   LABPROT 13.1  --   --   INR 1.00  --   --   HEPARINUNFRC  --   --  0.44  CREATININE 0.80  --   --   TROPONINI <0.03 0.14* 0.55*    Estimated Creatinine Clearance: 73.2 mL/min (by C-G formula based on SCr of 0.8 mg/dL).   Medical History: Past Medical History:  Diagnosis Date  . Hyperlipemia   . Hypertension     Medications:  Infusions:  . heparin 750 Units/hr (01/25/17 1345)  . nitroGLYCERIN 5 mcg/min (01/25/17 2129)    Assessment: 51 yof cc chest pain/SOB with exertion. Pharmacy consulted to dose heparin for ACS. RN reports no OAC PTA but we will f/u PTA when available.  Goal of Therapy:  Heparin level 0.3-0.7 units/ml Monitor platelets by anticoagulation protocol: Yes   Plan:  Give 3700 units bolus x 1 Start heparin infusion at 750 units/hr Check anti-Xa level in 6 hours and daily while on heparin Continue to monitor H&H and platelets  01/25/17 20:31 HL therapeutic x 1. Continue current rate. Will recheck HL in 6 hours.  Laural Benes, Pharm.D., BCPS Clinical Pharmacist 01/25/2017,9:29 PM

## 2017-01-25 NOTE — ED Notes (Signed)
Heparin witnessed by Dorian Pod, RN

## 2017-01-25 NOTE — ED Provider Notes (Signed)
[facility] Emergency Department Provider Note    First MD Initiated Contact with Patient 01/25/17 1255     (approximate)  I have reviewed the triage vital signs and the nursing notes.   HISTORY  Chief Complaint Chest Pain    HPI Aliana Kreischer is a 51 y.o. female with a history of hyperlipidemia as well as hypertension with no previous cardiac history presents to the ER with midsternal initially left chest pain and pressure radiating to the mid chest that started around 730 this morning while the patient was taking a shower.  States she is having exertional chest pain.  Denies any diaphoresis.  No recent fevers.  No nausea or vomiting.  Does have positive family history of heart disease but no personal history.  States she has been quite stressed out recently while caring for her mother who suffers from advanced dementia.  Patient went to church today and started having worsening pain so she was brought to the ER.  Past Medical History:  Diagnosis Date  . Hyperlipemia   . Hypertension    Family History  Problem Relation Age of Onset  . Breast cancer Maternal Grandmother    Past Surgical History:  Procedure Laterality Date  . ABDOMINAL HYSTERECTOMY    . APPENDECTOMY     Patient Active Problem List   Diagnosis Date Noted  . Unstable angina ([place]) 01/25/2017  . HTN (hypertension) 01/25/2017  . HLD (hyperlipidemia) 01/25/2017  . SOB (shortness of breath) 01/25/2017      Prior to Admission medications   Medication Sig Start Date End Date Taking? Authorizing Provider  aspirin EC 81 MG tablet Take 81 mg by mouth daily.   Yes [provider]  fexofenadine (ALLEGRA) 180 MG tablet Take 180 mg by mouth daily.   Yes [provider]  losartan (COZAAR) 100 MG tablet Take 100 mg by mouth daily. 12/06/16  Yes [provider]  rosuvastatin (CRESTOR) 20 MG tablet Take 20 mg by mouth every evening. 12/22/16  Yes [provider]     Allergies Patient has no known allergies.    Social History Social History   Tobacco Use  . Smoking status: Never Smoker  . Smokeless tobacco: Never Used  Substance Use Topics  . Alcohol use: No    Frequency: Never  . Drug use: No    Review of Systems Patient denies headaches, rhinorrhea, blurry vision, numbness, shortness of breath, chest pain, edema, cough, abdominal pain, nausea, vomiting, diarrhea, dysuria, fevers, rashes or hallucinations unless otherwise stated above in HPI. ____________________________________________   PHYSICAL EXAM:  VITAL SIGNS: Vitals:   01/25/17 1241  Pulse: (!) 102  Resp: 18  Temp: 98 F (36.7 C)  SpO2: 100%    Constitutional: Alert and oriented. Well appearing and in no acute distress. Eyes: Conjunctivae are normal.  Head: Atraumatic. Nose: No congestion/rhinnorhea. Mouth/Throat: Mucous membranes are moist.   Neck: No stridor. Painless ROM.  Cardiovascular: Normal rate, regular rhythm. Grossly normal heart sounds.  Good peripheral circulation. Respiratory: Normal respiratory effort.  No retractions. Lungs CTAB. Gastrointestinal: Soft and nontender. No distention. No abdominal bruits. No CVA tenderness. Genitourinary:  Musculoskeletal: No lower extremity tenderness nor edema.  No joint effusions. Neurologic:  Normal speech and language. No gross focal neurologic deficits are appreciated. No facial droop Skin:  Skin is warm, dry and intact. No rash noted. Psychiatric: Mood and affect are normal. Speech and behavior are normal.  ____________________________________________   LABS (all labs ordered are listed, but  only abnormal results are displayed)  Results for orders placed or performed during the hospital encounter of 01/25/17 (from the past 24 hour(s))  Basic metabolic panel     Status: Abnormal   Collection Time: 01/25/17 12:42 PM  Result Value Ref Range   Sodium 137 135 - 145 mmol/L   Potassium 3.8 3.5 - 5.1 mmol/L    Chloride 105 101 - 111 mmol/L   CO2 24 22 - 32 mmol/L   Glucose, Bld 138 (H) 65 - 99 mg/dL   BUN 10 6 - 20 mg/dL   Creatinine, Ser 0.80 0.44 - 1.00 mg/dL   Calcium 10.5 (H) 8.9 - 10.3 mg/dL   GFR calc non Af Amer >60 >60 mL/min   GFR calc Af Amer >60 >60 mL/min   Anion gap 8 5 - 15  CBC     Status: Abnormal   Collection Time: 01/25/17 12:42 PM  Result Value Ref Range   WBC 7.7 3.6 - 11.0 K/uL   RBC 4.90 3.80 - 5.20 MIL/uL   Hemoglobin 12.5 12.0 - 16.0 g/dL   HCT 38.8 35.0 - 47.0 %   MCV 79.1 (L) 80.0 - 100.0 fL   MCH 25.6 (L) 26.0 - 34.0 pg   MCHC 32.3 32.0 - 36.0 g/dL   RDW 14.4 11.5 - 14.5 %   Platelets 245 150 - 440 K/uL  Troponin I     Status: None   Collection Time: 01/25/17 12:42 PM  Result Value Ref Range   Troponin I <0.03 <0.03 ng/mL  Protime-INR (order if Patient is taking Coumadin / Warfarin)     Status: None   Collection Time: 01/25/17 12:42 PM  Result Value Ref Range   Prothrombin Time 13.1 11.4 - 15.2 seconds   INR 1.00    ____________________________________________  EKG My review and personal interpretation at Time: 12:34   Indication: chest pain  Rate: 110  Rhythm: sinus Axis: normal Other: inferolateral st depressions,  1MM elevation in AVR   My review and personal interpretation at Time: 12:43  Indication: chet pain  Rate: 100  Rhythm: sinus Axis: normal Other: persistent inferolateral st depressions   My review and personal interpretation at Time: 13:03   Indication: chet pain  Rate: 115  Rhythm: sinus Axis: normal Other: unchanged  My review and personal interpretation at Time: 13:12   Indication: chest pain  Rate: 107  Rhythm: sinus Axis: normal Other: Posterior leads show no evidence of ST elevation MI.  ____________________________________________  RADIOLOGY  I personally reviewed all radiographic images ordered to evaluate for the above acute complaints and reviewed radiology reports and findings.  These findings were personally  discussed with the patient.  Please see medical record for radiology report.  ____________________________________________   PROCEDURES  Procedure(s) performed:  .Critical Care Performed by: Merlyn Lot, MD Authorized by: Merlyn Lot, MD   Critical care provider statement:    Critical care time (minutes):  30   Critical care time was exclusive of:  Separately billable procedures and treating other patients   Critical care was necessary to treat or prevent imminent or life-threatening deterioration of the following conditions:  Cardiac failure   Critical care was time spent personally by me on the following activities:  Development of treatment plan with patient or surrogate, discussions with consultants, evaluation of patient's response to treatment, examination of patient, obtaining history from patient or surrogate, ordering and performing treatments and interventions, ordering and review of laboratory studies, ordering and review of radiographic studies, pulse  oximetry, re-evaluation of patient's condition and review of old [place] performed: yes ____________________________________________   INITIAL IMPRESSION / ASSESSMENT AND PLAN / ED COURSE  Pertinent labs & imaging results that were available during my care of the patient were reviewed by me and considered in my medical decision making (see chart for details).  DDX: .ddcchest   Kanoelani Dobies is a 51 y.o. who presents to the ED with typical chest pain as described above arriving with EKG changes concerning for inferolateral ischemia and acute MI.  Serial EKGs do show dynamic changes but no ST elevation.  Patient started on IV heparin as well as IV nitroglycerin.  Not clinically consistent with dissection.  No evidence of pulmonary embolism.  Patient given several nitro as well as aspirin.  The patient will be placed on continuous pulse oximetry and telemetry for monitoring.  Laboratory evaluation  will be sent to evaluate for the above complaints.  Initial troponin was negative.  Did have some relief in symptoms after IV pain medication as well as nitroglycerin.  Discussed case with Dr. Yancey Flemings of cardiology.  Patient does not meet criteria for ST elevation MI but EKG changes are concerning for some component of angina versus [place] Subu cardiomyopathy.  Based on her risk factors I do believe patient will require admission the hospital for hemodynamic monitoring and further medical management.  Have discussed with the patient and available family all diagnostics and treatments performed thus far and all questions were answered to the best of my ability. The patient demonstrates understanding and agreement with plan.        ____________________________________________   FINAL CLINICAL IMPRESSION(S) / ED DIAGNOSES  Final diagnoses:  Chest pain, unspecified type  Abnormal EKG      NEW MEDICATIONS STARTED DURING THIS VISIT:  This SmartLink is deprecated. Use AVSMEDLIST instead to display the medication list for a patient.   Note:  This document was prepared using Dragon voice recognition software and may include unintentional dictation errors.    Merlyn Lot, MD 01/25/17 [phone]

## 2017-01-25 NOTE — Progress Notes (Signed)
Patient complaining of 7/10 mid sternal chest pain, states that it feels like intense heart burn. Morphine given, patient reports some initial relief. Patient started to experience some nausea, zofran given. MD paged to report to bedside. EKG and SL NTG given. EKG shows acute MI, with ST Elevation. MD at bedside.   Nitroglycerin gtt initiated with 1L bolus running simultaneously for BP control. Patient still complaining of chest pain. No relief noted from any interventions this far. Code STEMI called. Patient hooked to zoll, heparin gtt stopped. Patient transported to cath lab with NT, and Lelon Frohlich, house supervisor. Report given to cath lab staff. All questions answered. Son, Dion Saucier, updated.

## 2017-01-26 ENCOUNTER — Encounter
Admission: EM | Disposition: A | Discharge status: Home / Self Care | Payer: Self-pay | Source: Home / Self Care | Attending: Internal Medicine

## 2017-01-26 ENCOUNTER — Encounter: Payer: Self-pay | Admitting: Internal Medicine

## 2017-01-26 ENCOUNTER — Inpatient Hospital Stay: Payer: BLUE CROSS/BLUE SHIELD

## 2017-01-26 DIAGNOSIS — R9431 Abnormal electrocardiogram [ECG] [EKG]: Secondary | ICD-10-CM | POA: Diagnosis present

## 2017-01-26 DIAGNOSIS — I25110 Atherosclerotic heart disease of native coronary artery with unstable angina pectoris: Secondary | ICD-10-CM | POA: Diagnosis present

## 2017-01-26 DIAGNOSIS — J9600 Acute respiratory failure, unspecified whether with hypoxia or hypercapnia: Secondary | ICD-10-CM | POA: Diagnosis not present

## 2017-01-26 DIAGNOSIS — R0602 Shortness of breath: Secondary | ICD-10-CM

## 2017-01-26 DIAGNOSIS — I200 Unstable angina: Secondary | ICD-10-CM

## 2017-01-26 DIAGNOSIS — E785 Hyperlipidemia, unspecified: Secondary | ICD-10-CM | POA: Diagnosis present

## 2017-01-26 DIAGNOSIS — I471 Supraventricular tachycardia: Secondary | ICD-10-CM | POA: Diagnosis present

## 2017-01-26 DIAGNOSIS — I2510 Atherosclerotic heart disease of native coronary artery without angina pectoris: Secondary | ICD-10-CM | POA: Diagnosis not present

## 2017-01-26 DIAGNOSIS — R001 Bradycardia, unspecified: Secondary | ICD-10-CM | POA: Diagnosis present

## 2017-01-26 DIAGNOSIS — Z79899 Other long term (current) drug therapy: Secondary | ICD-10-CM | POA: Diagnosis not present

## 2017-01-26 DIAGNOSIS — I959 Hypotension, unspecified: Secondary | ICD-10-CM | POA: Diagnosis not present

## 2017-01-26 DIAGNOSIS — I10 Essential (primary) hypertension: Secondary | ICD-10-CM | POA: Diagnosis present

## 2017-01-26 DIAGNOSIS — I2121 ST elevation (STEMI) myocardial infarction involving left circumflex coronary artery: Principal | ICD-10-CM

## 2017-01-26 DIAGNOSIS — Z8249 Family history of ischemic heart disease and other diseases of the circulatory system: Secondary | ICD-10-CM | POA: Diagnosis not present

## 2017-01-26 DIAGNOSIS — E8770 Fluid overload, unspecified: Secondary | ICD-10-CM | POA: Diagnosis not present

## 2017-01-26 DIAGNOSIS — I213 ST elevation (STEMI) myocardial infarction of unspecified site: Secondary | ICD-10-CM | POA: Diagnosis present

## 2017-01-26 DIAGNOSIS — J209 Acute bronchitis, unspecified: Secondary | ICD-10-CM | POA: Diagnosis present

## 2017-01-26 DIAGNOSIS — R7301 Impaired fasting glucose: Secondary | ICD-10-CM | POA: Diagnosis present

## 2017-01-26 DIAGNOSIS — Z7982 Long term (current) use of aspirin: Secondary | ICD-10-CM | POA: Diagnosis not present

## 2017-01-26 LAB — CBC
HCT: 36.5 % (ref 35.0–47.0)
HEMOGLOBIN: 11.9 g/dL — AB (ref 12.0–16.0)
MCH: 25.9 pg — AB (ref 26.0–34.0)
MCHC: 32.5 g/dL (ref 32.0–36.0)
MCV: 79.7 fL — ABNORMAL LOW (ref 80.0–100.0)
Platelets: 234 10*3/uL (ref 150–440)
RBC: 4.58 MIL/uL (ref 3.80–5.20)
RDW: 14.6 % — AB (ref 11.5–14.5)
WBC: 10.9 10*3/uL (ref 3.6–11.0)

## 2017-01-26 LAB — COMPREHENSIVE METABOLIC PANEL
ALBUMIN: 3.8 g/dL (ref 3.5–5.0)
ALK PHOS: 65 U/L (ref 38–126)
ALT: 29 U/L (ref 14–54)
ANION GAP: 8 (ref 5–15)
AST: 92 U/L — ABNORMAL HIGH (ref 15–41)
BUN: 7 mg/dL (ref 6–20)
CO2: 21 mmol/L — ABNORMAL LOW (ref 22–32)
CREATININE: 0.61 mg/dL (ref 0.44–1.00)
Calcium: 9 mg/dL (ref 8.9–10.3)
Chloride: 109 mmol/L (ref 101–111)
GFR calc non Af Amer: >60 mL/min (ref >60)
GLUCOSE: 189 mg/dL — AB (ref 65–99)
POTASSIUM: 3.6 mmol/L (ref 3.5–5.1)
SODIUM: 138 mmol/L (ref 135–145)
TOTAL PROTEIN: 6.6 g/dL (ref 6.5–8.1)
Total Bilirubin: 1 mg/dL (ref 0.3–1.2)

## 2017-01-26 LAB — GLUCOSE, CAPILLARY
GLUCOSE-CAPILLARY: 162 mg/dL — AB (ref 65–99)
GLUCOSE-CAPILLARY: 168 mg/dL — AB (ref 65–99)
GLUCOSE-CAPILLARY: 192 mg/dL — AB (ref 65–99)
GLUCOSE-CAPILLARY: 200 mg/dL — AB (ref 65–99)
Glucose-Capillary: 170 mg/dL — ABNORMAL HIGH (ref 65–99)

## 2017-01-26 LAB — PHOSPHORUS: PHOSPHORUS: 1.7 mg/dL — AB (ref 2.5–4.6)

## 2017-01-26 LAB — CREATININE, SERUM: CREATININE: 0.62 mg/dL (ref 0.44–1.00)

## 2017-01-26 LAB — TROPONIN I: TROPONIN I: 14.82 ng/mL — AB (ref <0.03)

## 2017-01-26 LAB — HEMOGLOBIN A1C
HEMOGLOBIN A1C: 6.2 % — AB (ref 4.8–5.6)
MEAN PLASMA GLUCOSE: 131.24 mg/dL

## 2017-01-26 LAB — POCT ACTIVATED CLOTTING TIME
ACTIVATED CLOTTING TIME: 219 s
ACTIVATED CLOTTING TIME: 263 s

## 2017-01-26 LAB — MAGNESIUM: MAGNESIUM: 1.6 mg/dL — AB (ref 1.7–2.4)

## 2017-01-26 LAB — MRSA PCR SCREENING: MRSA BY PCR: NEGATIVE

## 2017-01-26 SURGERY — LEFT HEART CATH AND CORONARY ANGIOGRAPHY
Anesthesia: Moderate Sedation

## 2017-01-26 MED ORDER — SODIUM CHLORIDE 0.9 % IV BOLUS (SEPSIS)
1000.0000 mL | Freq: Once | INTRAVENOUS | Status: AC
  Administered 2017-01-26: 1000 mL via INTRAVENOUS

## 2017-01-26 MED ORDER — SODIUM CHLORIDE 0.9% FLUSH
3.0000 mL | Freq: Two times a day (BID) | INTRAVENOUS | Status: DC
  Administered 2017-01-26 – 2017-01-29 (×8): 3 mL via INTRAVENOUS

## 2017-01-26 MED ORDER — METOPROLOL TARTRATE 25 MG PO TABS
25.0000 mg | ORAL_TABLET | Freq: Three times a day (TID) | ORAL | Status: DC
  Administered 2017-01-26 – 2017-01-29 (×5): 25 mg via ORAL
  Filled 2017-01-26 (×5): qty 1

## 2017-01-26 MED ORDER — SODIUM CHLORIDE 0.9 % IV SOLN
INTRAVENOUS | Status: AC
  Administered 2017-01-26: 01:00:00 via INTRAVENOUS

## 2017-01-26 MED ORDER — PANTOPRAZOLE SODIUM 40 MG PO TBEC
40.0000 mg | DELAYED_RELEASE_TABLET | Freq: Two times a day (BID) | ORAL | Status: DC
  Administered 2017-01-26: 40 mg via ORAL
  Filled 2017-01-26: qty 1

## 2017-01-26 MED ORDER — POTASSIUM PHOSPHATES 15 MMOLE/5ML IV SOLN
30.0000 mmol | Freq: Once | INTRAVENOUS | Status: AC
  Administered 2017-01-26: 30 mmol via INTRAVENOUS
  Filled 2017-01-26: qty 10

## 2017-01-26 MED ORDER — TICAGRELOR 90 MG PO TABS
90.0000 mg | ORAL_TABLET | Freq: Two times a day (BID) | ORAL | Status: DC
  Administered 2017-01-26 – 2017-01-30 (×9): 90 mg via ORAL
  Filled 2017-01-26 (×10): qty 1

## 2017-01-26 MED ORDER — BENZONATATE 100 MG PO CAPS
200.0000 mg | ORAL_CAPSULE | Freq: Three times a day (TID) | ORAL | Status: DC | PRN
  Administered 2017-01-26: 200 mg via ORAL
  Filled 2017-01-26: qty 2

## 2017-01-26 MED ORDER — ZOLPIDEM TARTRATE 5 MG PO TABS
5.0000 mg | ORAL_TABLET | Freq: Every evening | ORAL | Status: DC | PRN
  Administered 2017-01-26: 5 mg via ORAL
  Filled 2017-01-26: qty 1

## 2017-01-26 MED ORDER — ENOXAPARIN SODIUM 40 MG/0.4ML ~~LOC~~ SOLN
40.0000 mg | SUBCUTANEOUS | Status: DC
  Administered 2017-01-26 – 2017-01-29 (×4): 40 mg via SUBCUTANEOUS
  Filled 2017-01-26 (×4): qty 0.4

## 2017-01-26 MED ORDER — PANTOPRAZOLE SODIUM 40 MG IV SOLR
40.0000 mg | Freq: Two times a day (BID) | INTRAVENOUS | Status: DC
  Administered 2017-01-26: 40 mg via INTRAVENOUS
  Filled 2017-01-26: qty 40

## 2017-01-26 MED ORDER — NITROGLYCERIN 0.4 MG SL SUBL
0.4000 mg | SUBLINGUAL_TABLET | SUBLINGUAL | Status: DC | PRN
  Administered 2017-01-26 (×2): 0.4 mg via SUBLINGUAL
  Filled 2017-01-26 (×2): qty 1

## 2017-01-26 MED ORDER — FUROSEMIDE 10 MG/ML IJ SOLN
20.0000 mg | Freq: Once | INTRAMUSCULAR | Status: AC
  Administered 2017-01-26: 20 mg via INTRAVENOUS

## 2017-01-26 MED ORDER — SODIUM CHLORIDE 0.9% FLUSH
3.0000 mL | INTRAVENOUS | Status: DC | PRN
  Administered 2017-01-29: 3 mL via INTRAVENOUS
  Filled 2017-01-26: qty 3

## 2017-01-26 MED ORDER — HYDROCOD POLST-CPM POLST ER 10-8 MG/5ML PO SUER
5.0000 mL | Freq: Two times a day (BID) | ORAL | Status: DC | PRN
  Administered 2017-01-27 – 2017-01-28 (×2): 5 mL via ORAL
  Filled 2017-01-26 (×2): qty 5

## 2017-01-26 MED ORDER — ONDANSETRON HCL 4 MG/2ML IJ SOLN
4.0000 mg | Freq: Once | INTRAMUSCULAR | Status: AC
  Administered 2017-01-26: 4 mg via INTRAVENOUS

## 2017-01-26 MED ORDER — ROSUVASTATIN CALCIUM 20 MG PO TABS
40.0000 mg | ORAL_TABLET | Freq: Every evening | ORAL | Status: DC
  Administered 2017-01-26 – 2017-01-29 (×4): 40 mg via ORAL
  Filled 2017-01-26: qty 4
  Filled 2017-01-26: qty 2
  Filled 2017-01-26 (×3): qty 4

## 2017-01-26 MED ORDER — LEVALBUTEROL HCL 1.25 MG/0.5ML IN NEBU
1.2500 mg | INHALATION_SOLUTION | Freq: Four times a day (QID) | RESPIRATORY_TRACT | Status: DC | PRN
  Filled 2017-01-26: qty 0.5

## 2017-01-26 MED ORDER — CEPASTAT 14.5 MG MT LOZG
1.0000 | LOZENGE | OROMUCOSAL | Status: DC | PRN
  Filled 2017-01-26: qty 9

## 2017-01-26 MED ORDER — METOPROLOL TARTRATE 25 MG PO TABS
25.0000 mg | ORAL_TABLET | Freq: Once | ORAL | Status: AC
  Administered 2017-01-26: 25 mg via ORAL
  Filled 2017-01-26: qty 1

## 2017-01-26 MED ORDER — MORPHINE SULFATE (PF) 2 MG/ML IV SOLN
2.0000 mg | INTRAVENOUS | Status: DC | PRN
  Administered 2017-01-26: 2 mg via INTRAVENOUS
  Filled 2017-01-26: qty 1

## 2017-01-26 MED ORDER — HEPARIN SODIUM (PORCINE) 5000 UNIT/ML IJ SOLN: 5000.0000 [IU] | Freq: Three times a day (TID) | INTRAMUSCULAR | Status: DC

## 2017-01-26 MED ORDER — MAGNESIUM SULFATE 4 GM/100ML IV SOLN
4.0000 g | Freq: Once | INTRAVENOUS | Status: AC
  Administered 2017-01-26: 4 g via INTRAVENOUS
  Filled 2017-01-26: qty 100

## 2017-01-26 MED ORDER — LABETALOL HCL 5 MG/ML IV SOLN: 10.0000 mg | INTRAVENOUS | Status: AC | PRN

## 2017-01-26 MED ORDER — SODIUM CHLORIDE 0.9 % IV SOLN: 250.0000 mL | INTRAVENOUS | Status: DC | PRN

## 2017-01-26 MED ORDER — ORAL CARE MOUTH RINSE
15.0000 mL | Freq: Two times a day (BID) | OROMUCOSAL | Status: DC
  Administered 2017-01-26 – 2017-01-28 (×4): 15 mL via OROMUCOSAL

## 2017-01-26 MED ORDER — HYDRALAZINE HCL 20 MG/ML IJ SOLN: 5.0000 mg | INTRAMUSCULAR | Status: AC | PRN

## 2017-01-26 MED ORDER — ENALAPRIL MALEATE 2.5 MG PO TABS
2.5000 mg | ORAL_TABLET | Freq: Every day | ORAL | Status: DC
  Filled 2017-01-26 (×2): qty 1

## 2017-01-26 MED ORDER — FUROSEMIDE 10 MG/ML IJ SOLN
INTRAMUSCULAR | Status: AC
  Filled 2017-01-26: qty 2

## 2017-01-26 MED ORDER — LEVALBUTEROL HCL 0.63 MG/3ML IN NEBU
INHALATION_SOLUTION | RESPIRATORY_TRACT | Status: AC
  Administered 2017-01-26: 0.63 mg
  Filled 2017-01-26: qty 3

## 2017-01-26 MED ORDER — GUAIFENESIN-DM 100-10 MG/5ML PO SYRP
5.0000 mL | ORAL_SOLUTION | ORAL | Status: DC | PRN
  Administered 2017-01-26: 5 mL via ORAL
  Filled 2017-01-26: qty 5

## 2017-01-26 MED ORDER — PANTOPRAZOLE SODIUM 40 MG PO TBEC
40.0000 mg | DELAYED_RELEASE_TABLET | Freq: Every day | ORAL | Status: DC
  Administered 2017-01-27 – 2017-01-30 (×4): 40 mg via ORAL
  Filled 2017-01-26 (×4): qty 1

## 2017-01-26 MED ORDER — TIROFIBAN HCL IV 12.5 MG/250 ML
0.1500 ug/kg/min | INTRAVENOUS | Status: AC
  Administered 2017-01-26: 0.15 ug/kg/min via INTRAVENOUS
  Filled 2017-01-26: qty 250

## 2017-01-26 NOTE — Progress Notes (Signed)
Rapid Response Event Note  Overview:Rapid response called at 21:30.   MD had been paged and arrived at the same time.      Initial Focused Assessment:  Pt found lying in bed, complaining of chest pain, responsive to voice.  BP systolic in the 05'X.     Interventions:  MD at bedside, nitro drip ordered as well as 1L fluid bolus.  Plan of Care (if not transferred):  Code STEMI called, pt transferred to ICU room 13.  Event Summary:   at      at          [place]

## 2017-01-26 NOTE — Consult Note (Signed)
PULMONARY / CRITICAL CARE MEDICINE   Name: Jenna Boyer MRN: [id number] DOB: May 12, 1965    ADMISSION DATE:  01/25/2017 CONSULTATION DATE:  01/26/2017  REFERRING MD:  Dr. Jannifer Franklin  REASON: ICU management s/p [facility] HISTORY OF PRESENT ILLNESS:   51 y/o female admitted with chest pain and diagnosed with unstable angina. Thsi evening, patient developed worsening chest pain and hypotension. EKG showed changes consistent with myocardial ischemia/STEMI. She wass emergently taken to the cath lab and had a stent placed in the OM1. She reports significant improvement in chest pain. Describes CP as more of a discomfort at this time. Rates it at 5/10; no associated symptoms. Denies nausea, vomiting and diarhea  PAST MEDICAL HISTORY :  She  has a past medical history of Hyperlipemia and Hypertension.  PAST SURGICAL HISTORY: She  has a past surgical history that includes Appendectomy and Abdominal hysterectomy.  No Known Allergies  No current facility-administered medications on file prior to encounter.    Current Outpatient Medications on File Prior to Encounter  Medication Sig  . aspirin EC 81 MG tablet Take 81 mg by mouth daily.  . fexofenadine (ALLEGRA) 180 MG tablet Take 180 mg by mouth daily.  Marland Kitchen losartan (COZAAR) 100 MG tablet Take 100 mg by mouth daily.  . rosuvastatin (CRESTOR) 20 MG tablet Take 20 mg by mouth every evening.    FAMILY HISTORY:  Her indicated that the status of her maternal grandmother is unknown.   SOCIAL HISTORY: She  reports that  has never smoked. she has never used smokeless tobacco. She reports that she does not drink alcohol or use drugs.  REVIEW OF SYSTEMS:   Constitutional: Negative for fever and chills.  HENT: Negative for congestion and rhinorrhea.  Eyes: Negative for redness and visual disturbance.  Respiratory: Negative for shortness of breath and wheezing.  Cardiovascular: positive for chest pain; 5/10 in intensity, denies palpitations.   Gastrointestinal: Negative  for nausea , vomiting and abdominal pain and  Loose stools Genitourinary: Negative for dysuria and urgency.  Endocrine: Denies polyuria, polyphagia and heat intolerance Musculoskeletal: Negative for myalgias and arthralgias.  Skin: Negative for pallor and wound.  Neurological: Negative for dizziness and headaches   SUBJECTIVE: still c/o chest pain, much improved from pre-cath presentation  VITAL SIGNS: BP 112/73 (BP Location: Left Arm)   Pulse 99   Temp 97.9 F (36.6 C)   Resp (!) 25   Ht 5\' 1"  (1.549 m)   Wt 64.6 kg (142 lb 6.4 oz)   SpO2 95%   BMI 26.91 kg/m   HEMODYNAMICS:    VENTILATOR SETTINGS: FiO2 (%):  [21 %] 21 %  INTAKE / OUTPUT: I/O last 3 completed shifts: In: 633.1 [P.O.:120; I.V.:13.1; IV Piggyback:500] Out: -   PHYSICAL EXAMINATION: General: mild to moderate distress Neuro:  AAO X4, no focal deficits HEENT:  PERRLA, neck is supple, no JVD Cardiovascular:  Tachycardic, S1/X2, no MRG, no edema  Lungs:  CTAB Abdomen:  Normal bowel sounds, no distension Musculoskeletal:  No deformities Skin:  Warm and dry  LABS:  BMET Recent Labs  Lab 01/25/17 1242  NA 137  K 3.8  CL 105  CO2 24  BUN 10  CREATININE 0.80  GLUCOSE 138*    Electrolytes Recent Labs  Lab 01/25/17 1242  CALCIUM 10.5*    CBC Recent Labs  Lab 01/25/17 1242  WBC 7.7  HGB 12.5  HCT 38.8  PLT 245    Coag's Recent Labs  Lab 01/25/17 1242  APTT 30  INR 1.00    Sepsis Markers No results for input(s): LATICACIDVEN, PROCALCITON, O2SATVEN in the last 168 hours.  ABG No results for input(s): PHART, PCO2ART, PO2ART in the last 168 hours.  Liver Enzymes No results for input(s): AST, ALT, ALKPHOS, BILITOT, ALBUMIN in the last 168 hours.  Cardiac Enzymes Recent Labs  Lab 01/25/17 1242 01/25/17 1525 01/25/17 2031  TROPONINI <0.03 0.14* 0.55*    Glucose Recent Labs  Lab 01/25/17 1652 01/25/17 2046  GLUCAP 113* 115*     Imaging Dg Chest Portable 1 View  Result Date: 01/25/2017 CLINICAL DATA:  Chest pain and shortness of Breath EXAM: PORTABLE CHEST 1 VIEW COMPARISON:  None. FINDINGS: The heart size and mediastinal contours are within normal limits. Both lungs are clear. The visualized skeletal structures are unremarkable. IMPRESSION: No active disease. Electronically Signed   By: Kerby Moors M.D.   On: 01/25/2017 13:16   STUDIES:  Coronary/Graft Acute MI Revascularization  Intravascular Ultrasound/IVUS  LEFT HEART CATH AND CORONARY ANGIOGRAPHY  Conclusion   Conclusions: 1. Lateral/posterior STEMI with thrombotic occlusion of OM1. 2. 40-50% eccentric ostial/proximal LMCA stenosis, with IVUS demonstrating MLA of ~7-8 mm^2. 3. Mid and apical anterior hypokinesis with overall preserved left ventricular contraction. 4. Mildly elevated left ventricular filling pressure. 5. Successful IVUS-guided PCI to OM1 with placement of Xience Sierra 2.5 x 23 mm drug-eluting stent (post-dilated 3-3.5 mm) with 0% residual stenosis. Distal branchs of OM1 are occluded with thrombus (too small for intervention).  Recommendations: 1. Continue tirofiban for 4 hours. 2. Dual antiplatelet therapy with aspirin and ticagrelor for at least 12 months. 3. Aggressive secondary prevention, including high-intensity statin therapy. 4. If patient has recurrent chest pain, consider FFR of LMCA.     ECHO pending   SIGNIFICANT EVENTS: 12/30>admitted  LINES/TUBES: PIVs  DISCUSSION: 51 y/o AA female admitted with unstable angina, developed worsening chest pain and found to have an STEMI; Now s/p LHC with stent placement  ASSESSMENT STEMI S/P LHC with stent to the OM1 Sinus tacycardia CAD Hypertension Hyperlipidemia  PLAN Hemodynamics per ICU protocol STAT post cath labs Rest of the treatment plan per cardiology DVT prophylaxis  FAMILY  - Updates: patient updated on current treatment plan  - Inter-disciplinary  family meet or Palliative Care meeting due by: day [place]. [facility] ANP-BC Pulmonary and Critical Care Medicine [facility] Pager [phone] or [phone]  01/26/2017, 1:18 AM

## 2017-01-26 NOTE — Progress Notes (Signed)
Patient c/o cough and SOB at start of shift. PRN Robitussin was given, but patient felt no relief. Patient wanted something else for her cough and to sleep. Notified MD Jannifer Franklin and PRN Tessalon and Ambien ordered and given. Patient called out later with same complaint of difficulty breathing; SpO2 88-92% on 5L [place]. Notified MD Jannifer Franklin and PRN Xopenex and Tussionex ordered. Xopenex given with little relief and repeat SpO2 84-86% on 5L [place]. Notified MD Jannifer Franklin again and 20mg  lasix IV x1 as well as STAT chest X-ray ordered. Patient currently lethargic, but arousable. Nursing staff will continue to monitor for any further changes in patient status. Earleen Reaper, RN

## 2017-01-26 NOTE — Progress Notes (Signed)
MEDICATION RELATED CONSULT NOTE - Electrolyte    Pharmacy consulted for electrolyte management for 51 yo female admitted with STEMI - s/p PCI. Patient ordered potassium phosphate 69mmol IV x 1 and magnesium 4g IV this am. Goal potassium ~ 4 and magnesium ~ 2.    Plan:   Will recheck electrolytes with am labs and replace as warranted.   No Known Allergies  Patient Measurements: Height: 5\' 1"  (154.9 cm) Weight: 148 lb 13 oz (67.5 kg) IBW/kg (Calculated) : 47.8  Vital Signs: Temp: 98.9 F (37.2 C) (12/31 1200) Temp Source: Oral (12/31 1200) BP: 132/82 (12/31 1200) Pulse Rate: 105 (12/31 1200) Intake/Output from previous day: 12/30 0701 - 12/31 0700 In: 1397 [P.O.:120; I.V.:167; IV Piggyback:1110] Out: 1150 [Urine:1150] Intake/Output from this shift: No intake/output data recorded.  Labs: Recent Labs    01/25/17 1242 01/26/17 0133  WBC 7.7 10.9  HGB 12.5 11.9*  HCT 38.8 36.5  PLT 245 234  APTT 30  --   CREATININE 0.80 0.62  0.61  MG  --  1.6*  PHOS  --  1.7*  ALBUMIN  --  3.8  PROT  --  6.6  AST  --  92*  ALT  --  29  ALKPHOS  --  65  BILITOT  --  1.0   Estimated Creatinine Clearance: 74.8 mL/min (by C-G formula based on SCr of 0.61 mg/dL).   Pharmacy will continue to monitor and adjust per consult.   Coley Littles L 01/26/2017,1:07 PM

## 2017-01-26 NOTE — Progress Notes (Signed)
Results for BHUMI, GODBEY (MRN [id number]) as of 01/26/2017 03:07  Ref. Range 01/26/2017 01:33  Troponin I Latest Ref Range: <0.03 ng/mL 14.82 (HH)   Critical Lab value: Troponin I 14.82  NP notified. No further orders at this time. Will continue to monitor.

## 2017-01-26 NOTE — Care Management (Addendum)
Brilinta Voucher provided to patient. RNCM met with patient and she states she has a primary care MD in [place]. I have provided her with local providers if she chooses.

## 2017-01-26 NOTE — Progress Notes (Signed)
Patient assisted to [facility] and rechecked VS once back in bed. Latest BP 98/58 and SpO2 94% on 5L [place]. Will recheck in 1 hour. Earleen Reaper, RN

## 2017-01-26 NOTE — Progress Notes (Signed)
Patient ID: Jenna Boyer, female   DOB: November 30, 1965, 51 y.o.   MRN: [id number]  [employer] PROGRESS NOTE  Camia Dipinto YWV:[id number] DOB: September 11, 1965 DOA: 01/25/2017 PCP: Patient, No Pcp Per  HPI/Subjective: Patient feeling better but short of breath.  Some cough.  Patient under a lot of stress with taking care of her mother that has dementia.  No complaints of chest pain.  Objective: Vitals:   01/26/17 1400 01/26/17 1500  BP: 123/78 137/81  Pulse: (!) 113 (!) 104  Resp: (!) 21 (!) 27  Temp:    SpO2: 97% 93%    Filed Weights   01/25/17 1242 01/25/17 1513 01/26/17 0040  Weight: 65.8 kg (145 lb) 64.6 kg (142 lb 6.4 oz) 67.5 kg (148 lb 13 oz)    ROS: Review of Systems  Constitutional: Negative for chills and fever.  Eyes: Negative for blurred vision.  Respiratory: Positive for cough and shortness of breath.   Cardiovascular: Negative for chest pain.  Gastrointestinal: Negative for abdominal pain, constipation, diarrhea, nausea and vomiting.  Genitourinary: Negative for dysuria.  Musculoskeletal: Negative for joint pain.  Neurological: Negative for dizziness and headaches.   Exam: Physical Exam  Constitutional: She is oriented to person, place, and time.  HENT:  Nose: No mucosal edema.  Mouth/Throat: No oropharyngeal exudate or posterior oropharyngeal edema.  Eyes: Conjunctivae, EOM and lids are normal. Pupils are equal, round, and reactive to light.  Neck: No JVD present. Carotid bruit is not present. No edema present. No thyroid mass and no thyromegaly present.  Cardiovascular: S1 normal, S2 normal and normal heart sounds. Tachycardia present. Exam reveals no gallop.  No murmur heard. Pulses:      Dorsalis pedis pulses are 2+ on the right side, and 2+ on the left side.  Respiratory: No respiratory distress. She has decreased breath sounds in the right lower field and the left lower field. She has no wheezes. She has no rhonchi. She has rales in the right lower field  and the left lower field.  GI: Soft. Bowel sounds are normal. There is no tenderness.  Musculoskeletal:       Right ankle: She exhibits no swelling.       Left ankle: She exhibits no swelling.  Lymphadenopathy:    She has no cervical adenopathy.  Neurological: She is alert and oriented to person, place, and time. No cranial nerve deficit.  Skin: Skin is warm. No rash noted. Nails show no clubbing.  Psychiatric: She has a normal mood and affect.      Data Reviewed: Basic Metabolic Panel: Recent Labs  Lab 01/25/17 1242 01/26/17 0133  NA 137 138  K 3.8 3.6  CL 105 109  CO2 24 21*  GLUCOSE 138* 189*  BUN 10 7  CREATININE 0.80 0.62  0.61  CALCIUM 10.5* 9.0  MG  --  1.6*  PHOS  --  1.7*   Liver Function Tests: Recent Labs  Lab 01/26/17 0133  AST 92*  ALT 29  ALKPHOS 65  BILITOT 1.0  PROT 6.6  ALBUMIN 3.8   CBC: Recent Labs  Lab 01/25/17 1242 01/26/17 0133  WBC 7.7 10.9  HGB 12.5 11.9*  HCT 38.8 36.5  MCV 79.1* 79.7*  PLT 245 234   Cardiac Enzymes: Recent Labs  Lab 01/25/17 1242 01/25/17 1525 01/25/17 2031 01/26/17 0133  TROPONINI <0.03 0.14* 0.55* 14.82*    CBG: Recent Labs  Lab 01/25/17 1652 01/25/17 2046 01/26/17 0044 01/26/17 0749 01/26/17 1120  GLUCAP 113* 115* 162* 192*  170*    Recent Results (from the past 240 hour(s))  MRSA PCR Screening     Status: None   Collection Time: 01/26/17 12:41 AM  Result Value Ref Range Status   MRSA by PCR NEGATIVE NEGATIVE Final    Comment:        The GeneXpert MRSA Assay (FDA approved for NASAL specimens only), is one component of a comprehensive MRSA colonization surveillance program. It is not intended to diagnose MRSA infection nor to guide or monitor treatment for MRSA infections. Performed at [facility], [address] [place], [place] [address]      Studies: Dg Chest Portable 1 View  Result Date: 01/25/2017 CLINICAL DATA:  Chest pain and shortness of Breath EXAM:  PORTABLE CHEST 1 VIEW COMPARISON:  None. FINDINGS: The heart size and mediastinal contours are within normal limits. Both lungs are clear. The visualized skeletal structures are unremarkable. IMPRESSION: No active disease. Electronically Signed   By: Kerby Moors M.D.   On: 01/25/2017 13:16    Scheduled Meds: . aspirin EC  81 mg Oral Daily  . docusate sodium  100 mg Oral BID  . [START ON 01/27/2017] enalapril  2.5 mg Oral Daily  . enoxaparin (LOVENOX) injection  40 mg Subcutaneous Q24H  . insulin aspart  0-9 Units Subcutaneous TID WC  . loratadine  10 mg Oral Daily  . mouth rinse  15 mL Mouth Rinse BID  . metoprolol tartrate  25 mg Oral Q8H  . [START ON 01/27/2017] pantoprazole  40 mg Oral Daily  . rosuvastatin  40 mg Oral QPM  . sodium chloride flush  3 mL Intravenous Q12H  . ticagrelor  90 mg Oral BID   Continuous Infusions: . sodium chloride      Assessment/Plan:  1. STEMI status post urgent cardiac catheterization and stenting of OM1.  Patient on high dose Crestor, aspirin, Brilinta, metoprolol and low-dose enalapril.  Echocardiogram ordered for EF. 2. Shortness of breath.  Consider a dose of Lasix. 3. Hyperlipidemia unspecified.  Ordering lipid panel is standard of care for STEMI patients.  Patient on high-dose Crestor. 4. Impaired fasting glucose.  Hemoglobin A1c 6.2.  Patient not a diabetic at this point. 5. Patient under a lot of stress with taking care of her mom that has dementia. 6. Essential hypertension on metoprolol and enalapril  Code Status:     Code Status Orders  (From admission, onward)        Start     Ordered   01/25/17 1514  Full code  Continuous     01/25/17 1513    Code Status History    Date Active Date Inactive Code Status Order ID Comments User Context   This patient has a current code status but no historical code status.     Family Communication: Family at bedside Disposition Plan: To be determined based on clinical  course  Consultants:  Critical care specialist  Cardiology  Time spent: 28 minutes  [place]

## 2017-01-26 NOTE — Progress Notes (Signed)
Tirofiban consult.   Pharmacy consulted to continue tirofiban for 4 hours post procedure. Per RN PCI ended ~1220. Will schedule tirofiban to stop at 0430 12/31.  Sim Boast, PharmD, BCPS  01/26/17 1:10 AM

## 2017-01-26 NOTE — Progress Notes (Signed)
Hd runs of SVT and is in sinus tachycardia and will add metoprolol.

## 2017-01-26 NOTE — Progress Notes (Signed)
At approximately 0300 pt was having complaints of urinary retention and soon became very anxious over voiding. NP was notified of retention and put in orders to bladder scan and in and out catheterize if necessary. Before bladder scan could be completed pt had episode of emesis with bile appearance. As she was vomiting her heart rate elevated to the 150s and NP was notified. NP ordered Protonix and one time Zofran. EKG and bladder scan was also completed after pt had stabilized. Pt continued to have multifocal PVCs after event and one 12 run of VTAC. Pt is currently stable and is receiving replacement for Phos and Mag. Will continue to monitor.

## 2017-01-26 NOTE — Progress Notes (Signed)
Upon arrival to Rapid Assessment, chaplain provided [name] support in connecting with local pastor Celine Mans, [phone], [facility]). Chaplain offered [employer], scripture (Psalm 23), and companionship while her pastor was enroute. No family (mother - dementia; father out of area; and friend out of state). Chaplain supported pastor (another a member of church) while awaiting Jenna Boyer's return from Cath Lab and move to CCU.     01/26/17 0000  Clinical Encounter Type  Visited With Patient;Other (Comment)  Visit Type Critical Care  Referral From Nurse  Spiritual Encounters  Spiritual Needs Prayer;Emotional  Stress Factors  Patient Stress Factors Major life changes  Family Stress Factors Loss

## 2017-01-26 NOTE — Progress Notes (Signed)
Talked to Dr. Mortimer Fries about patient's feeling generally unwell and ill. She has not been able to eat or drink much and has no continuous fluid running through IV so I received an order for a one liter bolus IV of NS to help with dehydration.

## 2017-01-26 NOTE — Progress Notes (Signed)
SUBJECTIVE: Patient developed chest pain last night around 9:30 PM and I was called and STEMI protocol was activated.   Vitals:   01/26/17 0600 01/26/17 0700 01/26/17 0800 01/26/17 0900  BP: 118/71 123/73 121/75 124/77  Pulse: (!) 102 (!) 104 (!) 104 (!) 108  Resp: (!) 24 (!) 26 (!) 26 (!) 24  Temp:   98.8 F (37.1 C)   TempSrc:   Oral   SpO2: 96% 93% 94% 94%  Weight:      Height:        Intake/Output Summary (Last 24 hours) at 01/26/2017 0913 Last data filed at 01/26/2017 0700 Gross per 24 hour  Intake 1396.99 ml  Output 1150 ml  Net 246.99 ml    LABS: Basic Metabolic Panel: Recent Labs    01/25/17 1242 01/26/17 0133  NA 137 138  K 3.8 3.6  CL 105 109  CO2 24 21*  GLUCOSE 138* 189*  BUN 10 7  CREATININE 0.80 0.62  0.61  CALCIUM 10.5* 9.0  MG  --  1.6*  PHOS  --  1.7*   Liver Function Tests: Recent Labs    01/26/17 0133  AST 92*  ALT 29  ALKPHOS 65  BILITOT 1.0  PROT 6.6  ALBUMIN 3.8   No results for input(s): LIPASE, AMYLASE in the last 72 hours. CBC: Recent Labs    01/25/17 1242 01/26/17 0133  WBC 7.7 10.9  HGB 12.5 11.9*  HCT 38.8 36.5  MCV 79.1* 79.7*  PLT 245 234   Cardiac Enzymes: Recent Labs    01/25/17 1525 01/25/17 2031 01/26/17 0133  TROPONINI 0.14* 0.55* 14.82*   BNP: Invalid input(s): POCBNP D-Dimer: No results for input(s): DDIMER in the last 72 hours. Hemoglobin A1C: Recent Labs    01/25/17 1525  HGBA1C 6.2*   Fasting Lipid Panel: No results for input(s): CHOL, HDL, LDLCALC, TRIG, CHOLHDL, LDLDIRECT in the last 72 hours. Thyroid Function Tests: No results for input(s): TSH, T4TOTAL, T3FREE, THYROIDAB in the last 72 hours.  Invalid input(s): FREET3 Anemia Panel: No results for input(s): VITAMINB12, FOLATE, FERRITIN, TIBC, IRON, RETICCTPCT in the last 72 hours.   PHYSICAL EXAM General: Well developed, well nourished, in no acute distress HEENT:  Normocephalic and atramatic Neck:  No JVD.  Lungs: Clear  bilaterally to auscultation and percussion. Heart: HRRR . Normal S1 and S2 without gallops or murmurs.  Abdomen: Bowel sounds are positive, abdomen soft and non-tender  Msk:  Back normal, normal gait. Normal strength and tone for age. Extremities: No clubbing, cyanosis or edema.   Neuro: Alert and oriented X 3. Psych:  Good affect, responds appropriately  TELEMETRY: Sinus tachycardia  ASSESSMENT AND PLAN: Status post STEMI with occluded large obtuse marginal 1 which was stented but still has ostial left main disease 40% probably was causing spasm and hypotension. Currently in sinus tachycardia and would add metoprolol 25 mg every 8 and continue Protonix IV and use nitrates as needed. She had a EKG this morning which was normal and troponin had gone up to around 15. I was pleased last night at 1 AM about the progress of the patient and patient did very well after the angioplasty. Also last night spoke to Dr. Jannifer Franklin as well as Dr. Nelva Bush and proceeded with STEMI protocol because had recurrent chest pain. Patient initially was admitted in the ER with chest pain and had no elevation of the STs but had ST depression in the anterior leads and when I saw the patient and  she had no chest pain and with medical therapy and got significantly improved. However at 9:30 AM she started having chest pain again and STEMI protocol was activated. Patient is doing very well right now. Time spent around 2 hours last night.  Principal Problem:   Unstable angina (HCC) Active Problems:   HTN (hypertension)   HLD (hyperlipidemia)   SOB (shortness of breath)   ST elevation myocardial infarction involving left circumflex coronary artery (HCC)   STEMI (ST elevation myocardial infarction) (HCC)    Neoma Laming A, MD, [facility] 01/26/2017 9:13 AM

## 2017-01-26 NOTE — Progress Notes (Signed)
eLink Physician-Brief Progress Note Patient Name: Jenna Boyer DOB: 10/12/1965 MRN: [id number]   Date of Service  01/26/2017  HPI/Events of Note  51 yo female admitted with STEMI. Now s/p cardiac cath lab. No cath note in Epic yet. VSS. PCCM asked to assist with care.   eICU Interventions  No new orders.     Intervention Category Evaluation Type: New Patient Evaluation  Lysle Dingwall 01/26/2017, 1:07 AM

## 2017-01-27 ENCOUNTER — Inpatient Hospital Stay
Admit: 2017-01-27 | Discharge: 2017-01-27 | Disposition: A | Discharge status: Home / Self Care | Payer: BLUE CROSS/BLUE SHIELD | Attending: Internal Medicine | Admitting: Internal Medicine

## 2017-01-27 LAB — URINALYSIS, COMPLETE (UACMP) WITH MICROSCOPIC
BILIRUBIN URINE: NEGATIVE
Bacteria, UA: NONE SEEN
Glucose, UA: NEGATIVE mg/dL
HGB URINE DIPSTICK: NEGATIVE
KETONES UR: NEGATIVE mg/dL
LEUKOCYTES UA: NEGATIVE
NITRITE: NEGATIVE
PROTEIN: NEGATIVE mg/dL
RBC / HPF: NONE SEEN RBC/hpf (ref 0–5)
Specific Gravity, Urine: 1.008 (ref 1.005–1.030)
pH: 8 (ref 5.0–8.0)

## 2017-01-27 LAB — BASIC METABOLIC PANEL
ANION GAP: 5 (ref 5–15)
BUN: 8 mg/dL (ref 6–20)
CHLORIDE: 103 mmol/L (ref 101–111)
CO2: 24 mmol/L (ref 22–32)
Calcium: 9.1 mg/dL (ref 8.9–10.3)
Creatinine, Ser: 0.62 mg/dL (ref 0.44–1.00)
GFR calc Af Amer: >60 mL/min (ref >60)
Glucose, Bld: 168 mg/dL — ABNORMAL HIGH (ref 65–99)
POTASSIUM: 3.2 mmol/L — AB (ref 3.5–5.1)
SODIUM: 132 mmol/L — AB (ref 135–145)

## 2017-01-27 LAB — GLUCOSE, CAPILLARY
GLUCOSE-CAPILLARY: 163 mg/dL — AB (ref 65–99)
GLUCOSE-CAPILLARY: 165 mg/dL — AB (ref 65–99)
Glucose-Capillary: 114 mg/dL — ABNORMAL HIGH (ref 65–99)
Glucose-Capillary: 148 mg/dL — ABNORMAL HIGH (ref 65–99)

## 2017-01-27 LAB — ECHOCARDIOGRAM COMPLETE
Height: 61 in
Weight: 2307.2 oz

## 2017-01-27 LAB — PHOSPHORUS: Phosphorus: 1.9 mg/dL — ABNORMAL LOW (ref 2.5–4.6)

## 2017-01-27 LAB — LIPID PANEL
CHOLESTEROL: 151 mg/dL (ref 0–200)
HDL: 50 mg/dL (ref >40)
LDL CALC: 83 mg/dL (ref 0–99)
Total CHOL/HDL Ratio: 3 RATIO
Triglycerides: 89 mg/dL (ref <150)
VLDL: 18 mg/dL (ref 0–40)

## 2017-01-27 LAB — HIV ANTIBODY (ROUTINE TESTING W REFLEX): HIV Screen 4th Generation wRfx: NONREACTIVE

## 2017-01-27 LAB — MAGNESIUM: MAGNESIUM: 2 mg/dL (ref 1.7–2.4)

## 2017-01-27 MED ORDER — SODIUM CHLORIDE 0.9 % IV BOLUS (SEPSIS)
250.0000 mL | Freq: Once | INTRAVENOUS | Status: AC
  Administered 2017-01-27: 250 mL via INTRAVENOUS

## 2017-01-27 MED ORDER — SODIUM CHLORIDE 0.9 % IV BOLUS (SEPSIS)
500.0000 mL | Freq: Once | INTRAVENOUS | Status: AC
  Administered 2017-01-27: 500 mL via INTRAVENOUS

## 2017-01-27 MED ORDER — POTASSIUM CHLORIDE CRYS ER 20 MEQ PO TBCR
40.0000 meq | EXTENDED_RELEASE_TABLET | Freq: Once | ORAL | Status: AC
  Administered 2017-01-27: 40 meq via ORAL
  Filled 2017-01-27: qty 2

## 2017-01-27 MED ORDER — POTASSIUM PHOSPHATES 15 MMOLE/5ML IV SOLN
15.0000 mmol | Freq: Once | INTRAVENOUS | Status: AC
  Administered 2017-01-27: 15 mmol via INTRAVENOUS
  Filled 2017-01-27: qty 5

## 2017-01-27 MED ORDER — K PHOS MONO-SOD PHOS DI & MONO 155-852-130 MG PO TABS
250.0000 mg | ORAL_TABLET | Freq: Three times a day (TID) | ORAL | Status: AC
  Administered 2017-01-27 – 2017-01-28 (×3): 250 mg via ORAL
  Filled 2017-01-27 (×3): qty 1

## 2017-01-27 NOTE — Progress Notes (Signed)
Discussed patient's current condition with Dr.willis, low blood pressure after bolus, tachycardic and low pulse ox. Received orders for urinalysis, bolus and blood cultures. Held pm dose of metoprolol.

## 2017-01-27 NOTE — Plan of Care (Signed)
Patient has an elevated temperature. Administered tylenol for comfort. Applied oxygen for low pulse ox. Tested urine and blood cultures to look for any signs of infection. Patient is receiving a bolus for low blood pressure.

## 2017-01-27 NOTE — Progress Notes (Signed)
Patient ID: Jenna Boyer, female   DOB: [birth year], 52 y.o.   MRN: [id number]  [employer] PROGRESS NOTE  Jenna Boyer VQM:[id number] DOB: 10-09-1965 DOA: 01/25/2017 PCP: Patient, No Pcp Per  HPI/Subjective: Patient feeling better today.  No further nausea or vomiting.  Able to eat solid food today.  Was short of breath last night and required a Lasix injection.  Breathing better today and now this afternoon off oxygen.  Objective: Vitals:   01/27/17 0817 01/27/17 1200  BP:    Pulse: 72   Resp:    Temp:    SpO2:  96%    Filed Weights   01/25/17 1513 01/26/17 0040 01/27/17 0501  Weight: 64.6 kg (142 lb 6.4 oz) 67.5 kg (148 lb 13 oz) 65.4 kg (144 lb 3.2 oz)    ROS: Review of Systems  Constitutional: Negative for chills and fever.  Eyes: Negative for blurred vision.  Respiratory: Positive for cough and shortness of breath.   Cardiovascular: Negative for chest pain.  Gastrointestinal: Negative for abdominal pain, constipation, diarrhea, nausea and vomiting.  Genitourinary: Negative for dysuria.  Musculoskeletal: Negative for joint pain.  Neurological: Negative for dizziness and headaches.   Exam: Physical Exam  Constitutional: She is oriented to person, place, and time.  HENT:  Nose: No mucosal edema.  Mouth/Throat: No oropharyngeal exudate or posterior oropharyngeal edema.  Eyes: Conjunctivae, EOM and lids are normal. Pupils are equal, round, and reactive to light.  Neck: No JVD present. Carotid bruit is not present. No edema present. No thyroid mass and no thyromegaly present.  Cardiovascular: S1 normal, S2 normal and normal heart sounds. Tachycardia present. Exam reveals no gallop.  No murmur heard. Pulses:      Dorsalis pedis pulses are 2+ on the right side, and 2+ on the left side.  Respiratory: No respiratory distress. She has decreased breath sounds in the right lower field and the left lower field. She has no wheezes. She has no rhonchi. She has rales in the  right lower field and the left lower field.  GI: Soft. Bowel sounds are normal. There is no tenderness.  Musculoskeletal:       Right ankle: She exhibits no swelling.       Left ankle: She exhibits no swelling.  Lymphadenopathy:    She has no cervical adenopathy.  Neurological: She is alert and oriented to person, place, and time. No cranial nerve deficit.  Skin: Skin is warm. No rash noted. Nails show no clubbing.  Psychiatric: She has a normal mood and affect.      Data Reviewed: Basic Metabolic Panel: Recent Labs  Lab 01/25/17 1242 01/26/17 0133 01/27/17 0348  NA 137 138 132*  K 3.8 3.6 3.2*  CL 105 109 103  CO2 24 21* 24  GLUCOSE 138* 189* 168*  BUN 10 7 8   CREATININE 0.80 0.62  0.61 0.62  CALCIUM 10.5* 9.0 9.1  MG  --  1.6* 2.0  PHOS  --  1.7* 1.9*   Liver Function Tests: Recent Labs  Lab 01/26/17 0133  AST 92*  ALT 29  ALKPHOS 65  BILITOT 1.0  PROT 6.6  ALBUMIN 3.8   CBC: Recent Labs  Lab 01/25/17 1242 01/26/17 0133  WBC 7.7 10.9  HGB 12.5 11.9*  HCT 38.8 36.5  MCV 79.1* 79.7*  PLT 245 234   Cardiac Enzymes: Recent Labs  Lab 01/25/17 1242 01/25/17 1525 01/25/17 2031 01/26/17 0133  TROPONINI <0.03 0.14* 0.55* 14.82*    CBG: Recent Labs  Lab 01/26/17 1120 01/26/17 1702 01/26/17 2039 01/27/17 0811 01/27/17 1156  GLUCAP 170* 200* 168* 163* 165*    Recent Results (from the past 240 hour(s))  MRSA PCR Screening     Status: None   Collection Time: 01/26/17 12:41 AM  Result Value Ref Range Status   MRSA by PCR NEGATIVE NEGATIVE Final    Comment:        The GeneXpert MRSA Assay (FDA approved for NASAL specimens only), is one component of a comprehensive MRSA colonization surveillance program. It is not intended to diagnose MRSA infection nor to guide or monitor treatment for MRSA infections. Performed at [facility], [address] [place], [place] [address]      Studies: Dg Chest [facility] 1 View  Result Date:  01/26/2017 CLINICAL DATA:  Acute onset of cough after receiving bolus of fluid. Recent myocardial infarction. EXAM: PORTABLE CHEST 1 VIEW COMPARISON:  Chest radiograph performed 01/25/2017 FINDINGS: Bilateral airspace opacification, more prominent on the right, raises concern for pulmonary edema. Small right pleural effusion is noted. No pneumothorax is seen. The heart is at silhouette is borderline enlarged. No acute osseous abnormalities are identified. IMPRESSION: Bilateral airspace opacification, more prominent on the right, raises concern for new pulmonary edema. Small right pleural effusion noted. Borderline cardiomegaly. Electronically Signed   By: Garald Balding M.D.   On: 01/26/2017 23:24    Scheduled Meds: . aspirin EC  81 mg Oral Daily  . docusate sodium  100 mg Oral BID  . enalapril  2.5 mg Oral Daily  . enoxaparin (LOVENOX) injection  40 mg Subcutaneous Q24H  . insulin aspart  0-9 Units Subcutaneous TID WC  . loratadine  10 mg Oral Daily  . mouth rinse  15 mL Mouth Rinse BID  . metoprolol tartrate  25 mg Oral Q8H  . pantoprazole  40 mg Oral Daily  . rosuvastatin  40 mg Oral QPM  . sodium chloride flush  3 mL Intravenous Q12H  . ticagrelor  90 mg Oral BID   Continuous Infusions: . sodium chloride    . potassium PHOSPHATE IVPB (mmol) 15 mmol (01/27/17 1135)    Assessment/Plan:  1. STEMI status post urgent cardiac catheterization and stenting of OM1.  Patient on high dose Crestor, aspirin, Brilinta, metoprolol and low-dose enalapril.  Echocardiogram showed normal ejection fraction 2. Fluid overload responded to Lasix injection last night 3. Acute respiratory failure secondary to fluid overload.  Responded to Lasix drip and now off oxygen 4. Hyperlipidemia unspecified.  Patient on Crestor.  LDL 83 5. Impaired fasting glucose.  Hemoglobin A1c 6.2.  Patient not a diabetic at this point. 6. Patient under a lot of stress with taking care of her mom that has dementia. 7. relative  hypotension and tachycardia.  Since the patient is breathing better on hoping the heart rate will be better controlled.  Continue to give metoprolol and as heart rate comes down hopefully blood pressure will come up a little bit.  Code Status:     Code Status Orders  (From admission, onward)        Start     Ordered   01/25/17 1514  Full code  Continuous     01/25/17 1513    Code Status History    Date Active Date Inactive Code Status Order ID Comments User Context   This patient has a current code status but no historical code status.     Family Communication: Family at bedside Disposition Plan: Potentially home tomorrow  Consultants:  Critical care specialist  Cardiology  Time spent: 25 minutes  [place]

## 2017-01-27 NOTE — Progress Notes (Signed)
Notified MD Marcille Blanco of BP 92/59 and potassium 3.2; order given to hold AM dose of metoprolol and no new orders given in regards to potassium level. Nursing staff will continue to monitor for any changes in patient status. Earleen Reaper, RN

## 2017-01-27 NOTE — Progress Notes (Signed)
Notified Dr.Pyreddy of low blood pressure. Given orders to administer 500cc bolus times once.

## 2017-01-27 NOTE — Progress Notes (Signed)
SUBJECTIVE: Patient is feeling much better but had an episode where she got short of breath this morning   Vitals:   01/27/17 0046 01/27/17 0501 01/27/17 0813 01/27/17 0817  BP: 91/79 (!) 92/59 (!) 90/51   Pulse: (!) 122 (!) 110 (!) 122 72  Resp:  18 20   Temp:  99.8 F (37.7 C) 99.1 F (37.3 C)   TempSrc:  Oral Oral   SpO2: 93% 100% 99%   Weight:  144 lb 3.2 oz (65.4 kg)    Height:        Intake/Output Summary (Last 24 hours) at 01/27/2017 1144 Last data filed at 01/27/2017 1136 Gross per 24 hour  Intake 120 ml  Output 1850 ml  Net -1730 ml    LABS: Basic Metabolic Panel: Recent Labs    01/26/17 0133 01/27/17 0348  NA 138 132*  K 3.6 3.2*  CL 109 103  CO2 21* 24  GLUCOSE 189* 168*  BUN 7 8  CREATININE 0.62  0.61 0.62  CALCIUM 9.0 9.1  MG 1.6* 2.0  PHOS 1.7* 1.9*   Liver Function Tests: Recent Labs    01/26/17 0133  AST 92*  ALT 29  ALKPHOS 65  BILITOT 1.0  PROT 6.6  ALBUMIN 3.8   No results for input(s): LIPASE, AMYLASE in the last 72 hours. CBC: Recent Labs    01/25/17 1242 01/26/17 0133  WBC 7.7 10.9  HGB 12.5 11.9*  HCT 38.8 36.5  MCV 79.1* 79.7*  PLT 245 234   Cardiac Enzymes: Recent Labs    01/25/17 1525 01/25/17 2031 01/26/17 0133  TROPONINI 0.14* 0.55* 14.82*   BNP: Invalid input(s): POCBNP D-Dimer: No results for input(s): DDIMER in the last 72 hours. Hemoglobin A1C: Recent Labs    01/25/17 1525  HGBA1C 6.2*   Fasting Lipid Panel: Recent Labs    01/27/17 0348  CHOL 151  HDL 50  LDLCALC 83  TRIG 89  CHOLHDL 3.0   Thyroid Function Tests: No results for input(s): TSH, T4TOTAL, T3FREE, THYROIDAB in the last 72 hours.  Invalid input(s): FREET3 Anemia Panel: No results for input(s): VITAMINB12, FOLATE, FERRITIN, TIBC, IRON, RETICCTPCT in the last 72 hours.   PHYSICAL EXAM General: Well developed, well nourished, in no acute distress HEENT:  Normocephalic and atramatic Neck:  No JVD.  Lungs: Clear bilaterally to  auscultation and percussion. Heart: HRRR . Normal S1 and S2 without gallops or murmurs.  Abdomen: Bowel sounds are positive, abdomen soft and non-tender  Msk:  Back normal, normal gait. Normal strength and tone for age. Extremities: No clubbing, cyanosis or edema.   Neuro: Alert and oriented X 3. Psych:  Good affect, responds appropriately  TELEMETRY: Sinus rhythm  ASSESSMENT AND PLAN: Status post STEMI with occluded obtuse marginal requiring PCI with drug-eluting stent. Echocardiogram showed normal ejection fraction with normal wall motion and trace mitral regurgitation. Blood pressure is low but may need ACE inhibitor small dose on follow-up. Patient can be discharged tomorrow with follow-up next Tuesday at 11 AM in my office.  Principal Problem:   Unstable angina (HCC) Active Problems:   HTN (hypertension)   HLD (hyperlipidemia)   SOB (shortness of breath)   ST elevation myocardial infarction involving left circumflex coronary artery (HCC)   STEMI (ST elevation myocardial infarction) (HCC)    Jenna Boyer A, MD, [facility] 01/27/2017 11:44 AM

## 2017-01-27 NOTE — Progress Notes (Signed)
MEDICATION RELATED CONSULT NOTE - Electrolyte    Pharmacy consulted for electrolyte management for 52 yo female admitted with STEMI - s/p PCI.   Goal potassium ~ 4 and magnesium ~ 2.    Plan:  KCl 40 meq po x 1 and Kphos 15 mmol iv x 1. Will f/u AM labs.   No Known Allergies  Patient Measurements: Height: 5\' 1"  (154.9 cm) Weight: 144 lb 3.2 oz (65.4 kg) IBW/kg (Calculated) : 47.8  Vital Signs: Temp: 99.1 F (37.3 C) (01/01 0813) Temp Source: Oral (01/01 0813) BP: 90/51 (01/01 0813) Pulse Rate: 72 (01/01 0817) Intake/Output from previous day: 12/31 0701 - 01/01 0700 In: 0  Out: 950 [Urine:950] Intake/Output from this shift: Total I/O In: -  Out: 450 [Urine:450]  Labs: Sodium (mmol/L)  Date Value  01/27/2017 132 (L)   Potassium (mmol/L)  Date Value  01/27/2017 3.2 (L)   Magnesium (mg/dL)  Date Value  01/27/2017 2.0   Phosphorus (mg/dL)  Date Value  01/27/2017 1.9 (L)   Calcium (mg/dL)  Date Value  01/27/2017 9.1   Albumin (g/dL)  Date Value  01/26/2017 3.8     Pharmacy will continue to monitor and adjust per consult.   Ulice Dash D 01/27/2017,9:03 AM

## 2017-01-28 ENCOUNTER — Inpatient Hospital Stay: Payer: BLUE CROSS/BLUE SHIELD

## 2017-01-28 LAB — CBC WITH DIFFERENTIAL/PLATELET
BASOS ABS: 0.1 10*3/uL (ref 0–0.1)
Basophils Relative: 1 %
Eosinophils Absolute: 0.1 10*3/uL (ref 0–0.7)
Eosinophils Relative: 1 %
HEMATOCRIT: 29.2 % — AB (ref 35.0–47.0)
Hemoglobin: 9.6 g/dL — ABNORMAL LOW (ref 12.0–16.0)
LYMPHS PCT: 14 %
Lymphs Abs: 1 10*3/uL (ref 1.0–3.6)
MCH: 25.6 pg — ABNORMAL LOW (ref 26.0–34.0)
MCHC: 33 g/dL (ref 32.0–36.0)
MCV: 77.6 fL — ABNORMAL LOW (ref 80.0–100.0)
Monocytes Absolute: 0.9 10*3/uL (ref 0.2–0.9)
Monocytes Relative: 12 %
NEUTROS ABS: 5.3 10*3/uL (ref 1.4–6.5)
NEUTROS PCT: 72 %
PLATELETS: 187 10*3/uL (ref 150–440)
RBC: 3.76 MIL/uL — AB (ref 3.80–5.20)
RDW: 14.3 % (ref 11.5–14.5)
WBC: 7.3 10*3/uL (ref 3.6–11.0)

## 2017-01-28 LAB — BASIC METABOLIC PANEL
ANION GAP: 5 (ref 5–15)
BUN: 10 mg/dL (ref 6–20)
CALCIUM: 9.2 mg/dL (ref 8.9–10.3)
CO2: 22 mmol/L (ref 22–32)
Chloride: 111 mmol/L (ref 101–111)
Creatinine, Ser: 0.77 mg/dL (ref 0.44–1.00)
GLUCOSE: 138 mg/dL — AB (ref 65–99)
Potassium: 3.8 mmol/L (ref 3.5–5.1)
Sodium: 138 mmol/L (ref 135–145)

## 2017-01-28 LAB — GLUCOSE, CAPILLARY
GLUCOSE-CAPILLARY: 108 mg/dL — AB (ref 65–99)
Glucose-Capillary: 142 mg/dL — ABNORMAL HIGH (ref 65–99)
Glucose-Capillary: 158 mg/dL — ABNORMAL HIGH (ref 65–99)
Glucose-Capillary: 131 mg/dL — ABNORMAL HIGH (ref 65–99)

## 2017-01-28 LAB — MAGNESIUM: Magnesium: 2.1 mg/dL (ref 1.7–2.4)

## 2017-01-28 LAB — CORTISOL: Cortisol, Plasma: 8.1 ug/dL

## 2017-01-28 LAB — PHOSPHORUS: Phosphorus: 1.6 mg/dL — ABNORMAL LOW (ref 2.5–4.6)

## 2017-01-28 MED ORDER — DIGOXIN 0.25 MG/ML IJ SOLN
0.2500 mg | Freq: Every day | INTRAMUSCULAR | Status: DC
  Administered 2017-01-28 – 2017-01-29 (×2): 0.25 mg via INTRAVENOUS
  Filled 2017-01-28 (×3): qty 1

## 2017-01-28 MED ORDER — MIDODRINE HCL 5 MG PO TABS
2.5000 mg | ORAL_TABLET | Freq: Three times a day (TID) | ORAL | Status: DC
  Administered 2017-01-28: 2.5 mg via ORAL
  Filled 2017-01-28: qty 1

## 2017-01-28 MED ORDER — MIDODRINE HCL 5 MG PO TABS
10.0000 mg | ORAL_TABLET | Freq: Three times a day (TID) | ORAL | Status: DC
  Administered 2017-01-28 – 2017-01-30 (×6): 10 mg via ORAL
  Filled 2017-01-28 (×7): qty 2

## 2017-01-28 MED ORDER — POTASSIUM PHOSPHATES 15 MMOLE/5ML IV SOLN
15.0000 mmol | Freq: Once | INTRAVENOUS | Status: AC
  Administered 2017-01-28: 15 mmol via INTRAVENOUS
  Filled 2017-01-28: qty 5

## 2017-01-28 MED ORDER — DOXYCYCLINE HYCLATE 100 MG PO TABS
100.0000 mg | ORAL_TABLET | Freq: Two times a day (BID) | ORAL | Status: DC
  Administered 2017-01-28 – 2017-01-30 (×5): 100 mg via ORAL
  Filled 2017-01-28 (×5): qty 1

## 2017-01-28 NOTE — Progress Notes (Signed)
O2 sats on 2LNC 98%.  Sats on R/A 97 %.  No respiratory distress.  Will recheck sats in 1 hour.

## 2017-01-28 NOTE — Progress Notes (Signed)
SUBJECTIVE: Patient was short of breath this morning and remains dizzy and lightheaded   Vitals:   01/28/17 0051 01/28/17 0355 01/28/17 0356 01/28/17 0734  BP: (!) 84/51 (!) 83/53 (!) 83/51 (!) 81/51  Pulse: (!) 103 (!) 103 99 (!) 113  Resp:  17    Temp:  98.5 F (36.9 C)  99.5 F (37.5 C)  TempSrc:  Oral  Oral  SpO2: 94% 98%  98%  Weight:   144 lb 6.4 oz (65.5 kg)   Height:        Intake/Output Summary (Last 24 hours) at 01/28/2017 1101 Last data filed at 01/28/2017 0500 Gross per 24 hour  Intake 1430 ml  Output 2200 ml  Net -770 ml    LABS: Basic Metabolic Panel: Recent Labs    01/27/17 0348 01/28/17 0414  NA 132* 138  K 3.2* 3.8  CL 103 111  CO2 24 22  GLUCOSE 168* 138*  BUN 8 10  CREATININE 0.62 0.77  CALCIUM 9.1 9.2  MG 2.0 2.1  PHOS 1.9* 1.6*   Liver Function Tests: Recent Labs    01/26/17 0133  AST 92*  ALT 29  ALKPHOS 65  BILITOT 1.0  PROT 6.6  ALBUMIN 3.8   No results for input(s): LIPASE, AMYLASE in the last 72 hours. CBC: Recent Labs    01/26/17 0133 01/28/17 0910  WBC 10.9 7.3  NEUTROABS  --  5.3  HGB 11.9* 9.6*  HCT 36.5 29.2*  MCV 79.7* 77.6*  PLT 234 187   Cardiac Enzymes: Recent Labs    01/25/17 1525 01/25/17 2031 01/26/17 0133  TROPONINI 0.14* 0.55* 14.82*   BNP: Invalid input(s): POCBNP D-Dimer: No results for input(s): DDIMER in the last 72 hours. Hemoglobin A1C: Recent Labs    01/25/17 1525  HGBA1C 6.2*   Fasting Lipid Panel: Recent Labs    01/27/17 0348  CHOL 151  HDL 50  LDLCALC 83  TRIG 89  CHOLHDL 3.0   Thyroid Function Tests: No results for input(s): TSH, T4TOTAL, T3FREE, THYROIDAB in the last 72 hours.  Invalid input(s): FREET3 Anemia Panel: No results for input(s): VITAMINB12, FOLATE, FERRITIN, TIBC, IRON, RETICCTPCT in the last 72 hours.   PHYSICAL EXAM General: Well developed, well nourished, in no acute distress HEENT:  Normocephalic and atramatic Neck:  No JVD.  Lungs: Clear  bilaterally to auscultation and percussion. Heart: HRRR . Normal S1 and S2 without gallops or murmurs.  Abdomen: Bowel sounds are positive, abdomen soft and non-tender  Msk:  Back normal, normal gait. Normal strength and tone for age. Extremities: No clubbing, cyanosis or edema.   Neuro: Alert and oriented X 3. Psych:  Good affect, responds appropriately  TELEMETRY: Sinus tachycardia about 100 bpm  ASSESSMENT AND PLAN: Status post STEMI and status post PCI with drug-eluting stent of large obtuse marginal successfully with follow-up echocardiogram showing normal wall motion and left ventricular ejection fraction over 70%. Patient does have mild diastolic dysfunction. Blood pressure is running around 80 systolic and hemoglobin is 9.6 when she was admitted prior to myocardial infarction her hemoglobin was 12.5. She received anticoagulant and advise GI evaluation or at least guaiac testing. She also has low-grade temperature and blood cultures were done as she may have hospital-acquired infection. Cardiac status-wise her ejection fraction is normal. And EKG remains normal but we will repeat another EKG.  Principal Problem:   Unstable angina (HCC) Active Problems:   HTN (hypertension)   HLD (hyperlipidemia)   SOB (shortness of breath)  ST elevation myocardial infarction involving left circumflex coronary artery (HCC)   STEMI (ST elevation myocardial infarction) (HCC)    Tarrence Enck A, MD, [facility] 01/28/2017 11:01 AM

## 2017-01-28 NOTE — Progress Notes (Signed)
Updated Dr.Willis about patient's blood pressure, no new orders at this time. Continue to monitor and assess.

## 2017-01-28 NOTE — Progress Notes (Signed)
MEDICATION RELATED CONSULT NOTE - Electrolyte    Pharmacy consulted for electrolyte management for 52 yo female admitted with STEMI - s/p PCI.   Goal potassium ~ 4 and magnesium ~ 2.    Plan:  Phos 1.6. Will order Kphos 15 mmol iv x 1. Patient still receiving PO doses K Phos neutral as well.  Will f/u AM labs.   No Known Allergies  Patient Measurements: Height: 5\' 1"  (154.9 cm) Weight: 144 lb 6.4 oz (65.5 kg) IBW/kg (Calculated) : 47.8  Vital Signs: Temp: 99.5 F (37.5 C) (01/02 0734) Temp Source: Oral (01/02 0734) BP: 81/51 (01/02 0734) Pulse Rate: 113 (01/02 0734) Intake/Output from previous day: 01/01 0701 - 01/02 0700 In: 1550 [P.O.:720; IV Piggyback:830] Out: 2650 [Urine:2650] Intake/Output from this shift: No intake/output data recorded.  Labs: Sodium (mmol/L)  Date Value  01/28/2017 138   Potassium (mmol/L)  Date Value  01/28/2017 3.8   Magnesium (mg/dL)  Date Value  01/28/2017 2.1   Phosphorus (mg/dL)  Date Value  01/28/2017 1.6 (L)   Calcium (mg/dL)  Date Value  01/28/2017 9.2   Albumin (g/dL)  Date Value  01/26/2017 3.8     Pharmacy will continue to monitor and adjust per consult.   Pernell Dupre, PharmD, BCPS Clinical Pharmacist 01/28/2017 8:04 AM

## 2017-01-28 NOTE — Care Management (Signed)
Barrier to discharge- fever and low blood pressure

## 2017-01-28 NOTE — Progress Notes (Addendum)
Patient admitted with dx of STEMI with past medical history of HTN and HLD.  Patient is a NEVER smoker.  Patient S/P PCI/DES to OM1 on 01/25/2017.  Echo, which was performed on 01/27/2017, EF was 70%.    Patient sitting up in bed with oxygen in use.  Patient's cousin at bedside.  Patient gave this RN permission to speak in front of her cousin.    "Heart Attack Bouncing Back" booklet given and reviewed with patient and cousin. Discussed the definition of CAD. Reviewed the location of CAD and where stent was placed. Informed patient she will be receiving a stent card if she does not already have it.  Explained the purpose of the stent card. Instructed patient to keep stent card in her wallet.  ? Discussed modifiable risk factors including controlling blood pressure, cholesterol, and blood sugar; following heart healthy diet; maintaining healthy weight; exercise; and smoking cessation, if applicable. ?Note: Patient is a NEVER smoker.  ? Discussed cardiac medications including rationale for taking, mechanisms of action, and side effects. Stressed the importance of taking medications as prescribed.  ? Discussed emergency plan for heart attack symptoms. Patient verbalized understanding of need to call 911 and not to drive herself to ER if having cardiac symptoms / chest pain.  ? Heart healthy diet of low sodium, low fat, low cholesterol heart healthy diet discussed. Information on diet provided. Dietitian Consultation entered.   ? Exercise - Patient does not currently exercise. Benefits of exercise discussed. Explained to patient and cousin patient had been referred to Cardiac Rehab. Overview of the program provided. Both patient and cousin asked pertinent questions.  Patient interested in participating.   Patient wanting to wait to schedule orientation appointment due to completing FMLA forms.  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    ?Smoking Cessation - N/A.  NEVER SMOKER.   ? [facility] Women Living with Heart Disease. Information provided. Patient invited and encouraged to attend. Brochure given.  ? Patient and cousin thanked me for providing the above information.  ? ? Roanna Epley, RN, BSN, [facility] Nurse Navigator

## 2017-01-28 NOTE — Progress Notes (Signed)
Patient ID: Jenna Boyer, female   DOB: Sep 06, 1965, 52 y.o.   MRN: [id number]  [facility] PROGRESS NOTE  Jenna Boyer EHM:[id number] DOB: 1965/01/30 DOA: 01/25/2017 PCP: Patient, No Pcp Per  HPI/Subjective: Patient continued to be hypotensive heart rate improved, still gets short of breath with activity  Objective: Vitals:   01/28/17 1539 01/28/17 1628  BP:  (!) 100/59  Pulse:  99  Resp:  12  Temp: 98.3 F (36.8 C) 98.3 F (36.8 C)  SpO2: 97% 97%    Filed Weights   01/26/17 0040 01/27/17 0501 01/28/17 0356  Weight: 148 lb 13 oz (67.5 kg) 144 lb 3.2 oz (65.4 kg) 144 lb 6.4 oz (65.5 kg)    ROS: Review of Systems  Constitutional: Negative for chills and fever.  Eyes: Negative for blurred vision.  Respiratory: Positive for cough and shortness of breath.   Cardiovascular: Negative for chest pain.  Gastrointestinal: Negative for abdominal pain, constipation, diarrhea, nausea and vomiting.  Genitourinary: Negative for dysuria.  Musculoskeletal: Negative for joint pain.  Neurological: Negative for dizziness and headaches.   Exam: Physical Exam  Constitutional: She is oriented to person, place, and time.  HENT:  Nose: No mucosal edema.  Mouth/Throat: No oropharyngeal exudate or posterior oropharyngeal edema.  Eyes: Conjunctivae, EOM and lids are normal. Pupils are equal, round, and reactive to light.  Neck: No JVD present. Carotid bruit is not present. No edema present. No thyroid mass and no thyromegaly present.  Cardiovascular: S1 normal, S2 normal and normal heart sounds. Tachycardia present. Exam reveals no gallop.  No murmur heard. Pulses:      Dorsalis pedis pulses are 2+ on the right side, and 2+ on the left side.  Respiratory: No respiratory distress. She has decreased breath sounds in the right lower field and the left lower field. She has no wheezes. She has no rhonchi. She has rales in the right lower field and the left lower field.  GI: Soft. Bowel sounds  are normal. There is no tenderness.  Musculoskeletal:       Right ankle: She exhibits no swelling.       Left ankle: She exhibits no swelling.  Lymphadenopathy:    She has no cervical adenopathy.  Neurological: She is alert and oriented to person, place, and time. No cranial nerve deficit.  Skin: Skin is warm. No rash noted. Nails show no clubbing.  Psychiatric: She has a normal mood and affect.      Data Reviewed: Basic Metabolic Panel: Recent Labs  Lab 01/25/17 1242 01/26/17 0133 01/27/17 0348 01/28/17 0414  NA 137 138 132* 138  K 3.8 3.6 3.2* 3.8  CL 105 109 103 111  CO2 24 21* 24 22  GLUCOSE 138* 189* 168* 138*  BUN 10 7 8 10   CREATININE 0.80 0.62  0.61 0.62 0.77  CALCIUM 10.5* 9.0 9.1 9.2  MG  --  1.6* 2.0 2.1  PHOS  --  1.7* 1.9* 1.6*   Liver Function Tests: Recent Labs  Lab 01/26/17 0133  AST 92*  ALT 29  ALKPHOS 65  BILITOT 1.0  PROT 6.6  ALBUMIN 3.8   CBC: Recent Labs  Lab 01/25/17 1242 01/26/17 0133 01/28/17 0910  WBC 7.7 10.9 7.3  NEUTROABS  --   --  5.3  HGB 12.5 11.9* 9.6*  HCT 38.8 36.5 29.2*  MCV 79.1* 79.7* 77.6*  PLT 245 234 187   Cardiac Enzymes: Recent Labs  Lab 01/25/17 1242 01/25/17 1525 01/25/17 2031 01/26/17 0133  TROPONINI <  0.03 0.14* 0.55* 14.82*    CBG: Recent Labs  Lab 01/27/17 1652 01/27/17 2031 01/28/17 0734 01/28/17 1142 01/28/17 1639  GLUCAP 114* 148* 142* 158* 131*    Recent Results (from the past 240 hour(s))  MRSA PCR Screening     Status: None   Collection Time: 01/26/17 12:41 AM  Result Value Ref Range Status   MRSA by PCR NEGATIVE NEGATIVE Final    Comment:        The GeneXpert MRSA Assay (FDA approved for NASAL specimens only), is one component of a comprehensive MRSA colonization surveillance program. It is not intended to diagnose MRSA infection nor to guide or monitor treatment for MRSA infections. Performed at [facility], [address] [place], [place] [address]    Culture, blood (Routine X 2) w Reflex to ID Panel     Status: None (Preliminary result)   Collection Time: 01/27/17 10:16 PM  Result Value Ref Range Status   Specimen Description BLOOD RT HAND  Final   Special Requests   Final    BOTTLES DRAWN AEROBIC AND ANAEROBIC Blood Culture results may not be optimal due to an excessive volume of blood received in culture bottles   Culture   Final    NO GROWTH < 12 HOURS Performed at [facility], [address], [place], [place] [address]    Report Status PENDING  Incomplete  Culture, blood (Routine X 2) w Reflex to ID Panel     Status: None (Preliminary result)   Collection Time: 01/27/17 10:16 PM  Result Value Ref Range Status   Specimen Description BLOOD LT HAND  Final   Special Requests   Final    BOTTLES DRAWN AEROBIC AND ANAEROBIC Blood Culture results may not be optimal due to an excessive volume of blood received in culture bottles   Culture   Final    NO GROWTH < 12 HOURS Performed at [facility], [address], [place], [place] [address]    Report Status PENDING  Incomplete     Studies: Dg Chest 2 View  Result Date: 01/28/2017 CLINICAL DATA:  Shortness of breath, recent myocardial infarction. EXAM: CHEST  2 VIEW COMPARISON:  01/26/2017. FINDINGS: Trachea is midline. Heart size stable. Mild residual bibasilar airspace opacification and small bilateral pleural effusions. IMPRESSION: 1. Improving pulmonary edema with probable bibasilar atelectasis. 2. Small bilateral pleural effusions. Electronically Signed   By: Lorin Picket M.D.   On: 01/28/2017 09:56   Dg Chest Port 1 View  Result Date: 01/26/2017 CLINICAL DATA:  Acute onset of cough after receiving bolus of fluid. Recent myocardial infarction. EXAM: PORTABLE CHEST 1 VIEW COMPARISON:  Chest radiograph performed 01/25/2017 FINDINGS: Bilateral airspace opacification, more prominent on the right, raises concern for pulmonary edema. Small right pleural effusion is  noted. No pneumothorax is seen. The heart is at silhouette is borderline enlarged. No acute osseous abnormalities are identified. IMPRESSION: Bilateral airspace opacification, more prominent on the right, raises concern for new pulmonary edema. Small right pleural effusion noted. Borderline cardiomegaly. Electronically Signed   By: Garald Balding M.D.   On: 01/26/2017 23:24    Scheduled Meds: . aspirin EC  81 mg Oral Daily  . digoxin  0.25 mg Intravenous Daily  . docusate sodium  100 mg Oral BID  . doxycycline  100 mg Oral Q12H  . enoxaparin (LOVENOX) injection  40 mg Subcutaneous Q24H  . insulin aspart  0-9 Units Subcutaneous TID WC  . loratadine  10 mg Oral  Daily  . mouth rinse  15 mL Mouth Rinse BID  . metoprolol tartrate  25 mg Oral Q8H  . midodrine  10 mg Oral TID WC  . pantoprazole  40 mg Oral Daily  . rosuvastatin  40 mg Oral QPM  . sodium chloride flush  3 mL Intravenous Q12H  . ticagrelor  90 mg Oral BID   Continuous Infusions: . sodium chloride      Assessment/Plan:  1. STEMI status post urgent cardiac catheterization and stenting of OM1.  Patient on high dose Crestor, aspirin, Brilinta, hold metoprolol and low-dose enalapri due to low blood pressure l.  Echocardiogram showed normal ejection fraction 2. Hypotension hold enalapril and metoprolol I have started patient on midodrine that has seemed to help the blood pressure 3. Acute respiratory failure secondary to fluid overload.  Responded to Lasix continues to be short of breath I will repeat x-ray 4. Hyperlipidemia unspecified.  Patient on Crestor.  LDL 83 5. Impaired fasting glucose.  Hemoglobin A1c 6.2.  Patient not a diabetic at this point. 6.  Acute bronchitis patient started on antibiotics with low-grade fever Code Status:     Code Status Orders  (From admission, onward)        Start     Ordered   01/25/17 1514  Full code  Continuous     01/25/17 1513    Code Status History    Date Active Date Inactive  Code Status Order ID Comments User Context   This patient has a current code status but no historical code status.     Family Communication: Family at bedside Disposition Plan: Potentially home tomorrow  Consultants:  Critical care specialist  Cardiology  Time spent: 25 minutes  [place], [facility]

## 2017-01-28 NOTE — Plan of Care (Signed)
Patient is afebrile, blood pressure is improving with medication and no longer required supplemental oxygen. Educate patient about lifestyle changes and stress reduction due to care giver strain.

## 2017-01-29 LAB — CBC WITH DIFFERENTIAL/PLATELET
BASOS PCT: 1 %
Basophils Absolute: 0 10*3/uL (ref 0–0.1)
EOS ABS: 0.2 10*3/uL (ref 0–0.7)
Eosinophils Relative: 3 %
HCT: 29 % — ABNORMAL LOW (ref 35.0–47.0)
HEMOGLOBIN: 9.5 g/dL — AB (ref 12.0–16.0)
Lymphocytes Relative: 15 %
Lymphs Abs: 1 10*3/uL (ref 1.0–3.6)
MCH: 26 pg (ref 26.0–34.0)
MCHC: 32.9 g/dL (ref 32.0–36.0)
MCV: 79 fL — ABNORMAL LOW (ref 80.0–100.0)
Monocytes Absolute: 0.7 10*3/uL (ref 0.2–0.9)
Monocytes Relative: 11 %
NEUTROS PCT: 70 %
Neutro Abs: 4.5 10*3/uL (ref 1.4–6.5)
Platelets: 201 10*3/uL (ref 150–440)
RBC: 3.67 MIL/uL — AB (ref 3.80–5.20)
RDW: 14.4 % (ref 11.5–14.5)
WBC: 6.3 10*3/uL (ref 3.6–11.0)

## 2017-01-29 LAB — GLUCOSE, CAPILLARY
GLUCOSE-CAPILLARY: 137 mg/dL — AB (ref 65–99)
GLUCOSE-CAPILLARY: 115 mg/dL — AB (ref 65–99)
GLUCOSE-CAPILLARY: 96 mg/dL (ref 65–99)
Glucose-Capillary: 125 mg/dL — ABNORMAL HIGH (ref 65–99)

## 2017-01-29 LAB — BASIC METABOLIC PANEL
ANION GAP: 7 (ref 5–15)
BUN: 10 mg/dL (ref 6–20)
CO2: 21 mmol/L — ABNORMAL LOW (ref 22–32)
Calcium: 9.6 mg/dL (ref 8.9–10.3)
Chloride: 108 mmol/L (ref 101–111)
Creatinine, Ser: 0.75 mg/dL (ref 0.44–1.00)
GFR calc Af Amer: >60 mL/min (ref >60)
Glucose, Bld: 116 mg/dL — ABNORMAL HIGH (ref 65–99)
POTASSIUM: 3.8 mmol/L (ref 3.5–5.1)
SODIUM: 136 mmol/L (ref 135–145)

## 2017-01-29 LAB — CBC
HEMATOCRIT: 29 % — AB (ref 35.0–47.0)
Hemoglobin: 9.7 g/dL — ABNORMAL LOW (ref 12.0–16.0)
MCH: 26.4 pg (ref 26.0–34.0)
MCHC: 33.4 g/dL (ref 32.0–36.0)
MCV: 78.9 fL — ABNORMAL LOW (ref 80.0–100.0)
Platelets: 182 10*3/uL (ref 150–440)
RBC: 3.68 MIL/uL — ABNORMAL LOW (ref 3.80–5.20)
RDW: 14.8 % — AB (ref 11.5–14.5)
WBC: 6.7 10*3/uL (ref 3.6–11.0)

## 2017-01-29 LAB — PHOSPHORUS: PHOSPHORUS: 2.4 mg/dL — AB (ref 2.5–4.6)

## 2017-01-29 MED ORDER — K PHOS MONO-SOD PHOS DI & MONO 155-852-130 MG PO TABS
500.0000 mg | ORAL_TABLET | Freq: Two times a day (BID) | ORAL | Status: AC
  Administered 2017-01-29 (×2): 500 mg via ORAL
  Filled 2017-01-29 (×2): qty 2

## 2017-01-29 NOTE — Progress Notes (Addendum)
Nutrition Education Note  RD consulted for nutrition education regarding a Heart Healthy diet.   Patient admitted with dx of STEMI with past medical history of HTN and HLD.  Patient is a NEVER smoker.  Patient S/P PCI/DES to OM1 on 01/25/2017.  Echo, which was performed on 01/27/2017, EF was 70%.    Lipid Panel     Component Value Date/Time   CHOL 151 01/27/2017 0348   TRIG 89 01/27/2017 0348   HDL 50 01/27/2017 0348   CHOLHDL 3.0 01/27/2017 0348   VLDL 18 01/27/2017 0348   LDLCALC 83 01/27/2017 0348    RD provided "Heart Healthy Nutrition Therapy" handout from the Academy of [employer] and Dietetics. Reviewed patient's dietary recall. Provided examples on ways to decrease sodium and fat intake in diet. Discouraged intake of processed foods and use of salt shaker. Encouraged fresh fruits and vegetables as well as whole grain sources of carbohydrates to maximize fiber intake. Teach back method used.  Expect good compliance.  Body mass index is 27.08 kg/m. Pt meets criteria for overweight based on current BMI.  Current diet order is HH, patient is consuming approximately 100% of meals at this time. Labs and medications reviewed. No further nutrition interventions warranted at this time. RD contact information provided. If additional nutrition issues arise, please re-consult RD.  Koleen Distance MS, RD, LDN Pager #- [phone] After Hours Pager: [phone]

## 2017-01-29 NOTE — Progress Notes (Signed)
Patient ID: Jenna Boyer, female   DOB: 1966/01/28, 52 y.o.   MRN: [id number]  [facility] PROGRESS NOTE  Michell Giuliano AST:[id number] DOB: [birth year] DOA: 01/25/2017 PCP: Patient, No Pcp Per  HPI/Subjective: Blood pressure improved shortness of breath resolved Cough resolved  Objective: Vitals:   01/29/17 1007 01/29/17 1408  BP: 112/67 (!) 103/53  Pulse: 91 89  Resp:    Temp:    SpO2:      Filed Weights   01/27/17 0501 01/28/17 0356 01/29/17 0357  Weight: 144 lb 3.2 oz (65.4 kg) 144 lb 6.4 oz (65.5 kg) 143 lb 4.8 oz (65 kg)    ROS: Review of Systems  Constitutional: Negative for chills and fever.  Eyes: Negative for blurred vision.  Respiratory: Negative for cough and shortness of breath.   Cardiovascular: Negative for chest pain.  Gastrointestinal: Negative for abdominal pain, constipation, diarrhea, nausea and vomiting.  Genitourinary: Negative for dysuria.  Musculoskeletal: Negative for joint pain.  Neurological: Negative for dizziness and headaches.   Exam: Physical Exam  Constitutional: She is oriented to person, place, and time.  HENT:  Nose: No mucosal edema.  Mouth/Throat: No oropharyngeal exudate or posterior oropharyngeal edema.  Eyes: Conjunctivae, EOM and lids are normal. Pupils are equal, round, and reactive to light.  Neck: No JVD present. Carotid bruit is not present. No edema present. No thyroid mass and no thyromegaly present.  Cardiovascular: S1 normal, S2 normal and normal heart sounds. Tachycardia present. Exam reveals no gallop.  No murmur heard. Pulses:      Dorsalis pedis pulses are 2+ on the right side, and 2+ on the left side.  Respiratory: No respiratory distress. She has decreased breath sounds in the right lower field and the left lower field. She has no wheezes. She has no rhonchi. She has no rales.  GI: Soft. Bowel sounds are normal. There is no tenderness.  Musculoskeletal:       Right ankle: She exhibits no swelling.       Left  ankle: She exhibits no swelling.  Lymphadenopathy:    She has no cervical adenopathy.  Neurological: She is alert and oriented to person, place, and time. No cranial nerve deficit.  Skin: Skin is warm. No rash noted. Nails show no clubbing.  Psychiatric: She has a normal mood and affect.      Data Reviewed: Basic Metabolic Panel: Recent Labs  Lab 01/25/17 1242 01/26/17 0133 01/27/17 0348 01/28/17 0414 01/29/17 0347  NA 137 138 132* 138 136  K 3.8 3.6 3.2* 3.8 3.8  CL 105 109 103 111 108  CO2 24 21* 24 22 21*  GLUCOSE 138* 189* 168* 138* 116*  BUN 10 7 8 10 10   CREATININE 0.80 0.62  0.61 0.62 0.77 0.75  CALCIUM 10.5* 9.0 9.1 9.2 9.6  MG  --  1.6* 2.0 2.1  --   PHOS  --  1.7* 1.9* 1.6* 2.4*   Liver Function Tests: Recent Labs  Lab 01/26/17 0133  AST 92*  ALT 29  ALKPHOS 65  BILITOT 1.0  PROT 6.6  ALBUMIN 3.8   CBC: Recent Labs  Lab 01/25/17 1242 01/26/17 0133 01/28/17 0910 01/29/17 0347 01/29/17 0857  WBC 7.7 10.9 7.3 6.7 6.3  NEUTROABS  --   --  5.3  --  4.5  HGB 12.5 11.9* 9.6* 9.7* 9.5*  HCT 38.8 36.5 29.2* 29.0* 29.0*  MCV 79.1* 79.7* 77.6* 78.9* 79.0*  PLT 245 234 187 182 201   Cardiac Enzymes: Recent Labs  Lab 01/25/17 1242 01/25/17 1525 01/25/17 2031 01/26/17 0133  TROPONINI <0.03 0.14* 0.55* 14.82*    CBG: Recent Labs  Lab 01/28/17 1142 01/28/17 1639 01/28/17 2155 01/29/17 0722 01/29/17 1137  GLUCAP 158* 131* 108* 125* 137*    Recent Results (from the past 240 hour(s))  MRSA PCR Screening     Status: None   Collection Time: 01/26/17 12:41 AM  Result Value Ref Range Status   MRSA by PCR NEGATIVE NEGATIVE Final    Comment:        The GeneXpert MRSA Assay (FDA approved for NASAL specimens only), is one component of a comprehensive MRSA colonization surveillance program. It is not intended to diagnose MRSA infection nor to guide or monitor treatment for MRSA infections. Performed at [facility], [address] [place], [place] [address]   Culture, blood (Routine X 2) w Reflex to ID Panel     Status: None (Preliminary result)   Collection Time: 01/27/17 10:16 PM  Result Value Ref Range Status   Specimen Description BLOOD RT HAND  Final   Special Requests   Final    BOTTLES DRAWN AEROBIC AND ANAEROBIC Blood Culture results may not be optimal due to an excessive volume of blood received in culture bottles   Culture   Final    NO GROWTH 2 DAYS Performed at [facility], [address] [place], [place] [address]    Report Status PENDING  Incomplete  Culture, blood (Routine X 2) w Reflex to ID Panel     Status: None (Preliminary result)   Collection Time: 01/27/17 10:16 PM  Result Value Ref Range Status   Specimen Description BLOOD LT HAND  Final   Special Requests   Final    BOTTLES DRAWN AEROBIC AND ANAEROBIC Blood Culture results may not be optimal due to an excessive volume of blood received in culture bottles   Culture   Final    NO GROWTH 2 DAYS Performed at [facility], [address], [place], [place] [address]    Report Status PENDING  Incomplete     Studies: Dg Chest 2 View  Result Date: 01/28/2017 CLINICAL DATA:  Shortness of breath, recent myocardial infarction. EXAM: CHEST  2 VIEW COMPARISON:  01/26/2017. FINDINGS: Trachea is midline. Heart size stable. Mild residual bibasilar airspace opacification and small bilateral pleural effusions. IMPRESSION: 1. Improving pulmonary edema with probable bibasilar atelectasis. 2. Small bilateral pleural effusions. Electronically Signed   By: Lorin Picket M.D.   On: 01/28/2017 09:56    Scheduled Meds: . aspirin EC  81 mg Oral Daily  . digoxin  0.25 mg Intravenous Daily  . docusate sodium  100 mg Oral BID  . doxycycline  100 mg Oral Q12H  . enoxaparin (LOVENOX) injection  40 mg Subcutaneous Q24H  . insulin aspart  0-9 Units Subcutaneous TID WC  . loratadine  10 mg Oral Daily  . mouth rinse  15 mL Mouth Rinse BID  .  metoprolol tartrate  25 mg Oral Q8H  . midodrine  10 mg Oral TID WC  . pantoprazole  40 mg Oral Daily  . phosphorus  500 mg Oral BID  . rosuvastatin  40 mg Oral QPM  . sodium chloride flush  3 mL Intravenous Q12H  . ticagrelor  90 mg Oral BID   Continuous Infusions: . sodium chloride      Assessment/Plan:  1. STEMI status post urgent cardiac catheterization and stenting of OM1.  Patient on high dose Crestor,  aspirin, Brilinta, continue metoprolol due to elevated heart rate, hold enalapri due to low blood pressure l.  Echocardiogram showed normal ejection fraction 2. Hypotension improved with midodrine blood pressure currently stable 3. Acute respiratory failure secondary to fluid overload.  Responded to Lasix continues to be short of breath repeat chest x-ray shows resolution 4. Hyperlipidemia unspecified.  Patient on Crestor.  LDL 83 5. Impaired fasting glucose.  Hemoglobin A1c 6.2.  Patient not a diabetic at this point. 6.  Acute bronchitis patient started on antibiotics with low-grade fever Code Status:     Code Status Orders  (From admission, onward)        Start     Ordered   01/25/17 1514  Full code  Continuous     01/25/17 1513    Code Status History    Date Active Date Inactive Code Status Order ID Comments User Context   This patient has a current code status but no historical code status.     Family Communication: Family at bedside Disposition Plan: Potentially home tomorrow  Consultants:  Critical care specialist  Cardiology  Time spent: 25 minutes  [place], [facility]

## 2017-01-29 NOTE — Progress Notes (Signed)
MEDICATION RELATED CONSULT NOTE - Electrolyte   Pharmacy consulted for electrolyte management for 52 yo female admitted with STEMI - s/p PCI.   Goal potassium ~ 4 and magnesium ~ 2.    Plan:  Phos 2.4. K: 3.8. Will order  K Phos neutral  X 2 dosesl.  Will f/u with AM labs.   No Known Allergies  Patient Measurements: Height: 5\' 1"  (154.9 cm) Weight: 143 lb 4.8 oz (65 kg) IBW/kg (Calculated) : 47.8  Vital Signs: Temp: 98.8 F (37.1 C) (01/03 0357) Temp Source: Oral (01/03 0357) BP: 97/59 (01/03 0357) Pulse Rate: 108 (01/03 0357) Intake/Output from previous day: 01/02 0701 - 01/03 0700 In: 250 [I.V.:250] Out: 1250 [Urine:1250] Intake/Output from this shift: No intake/output data recorded.  Labs: Sodium (mmol/L)  Date Value  01/29/2017 136   Potassium (mmol/L)  Date Value  01/29/2017 3.8   Magnesium (mg/dL)  Date Value  01/28/2017 2.1   Phosphorus (mg/dL)  Date Value  01/29/2017 2.4 (L)   Calcium (mg/dL)  Date Value  01/29/2017 9.6   Albumin (g/dL)  Date Value  01/26/2017 3.8     Pharmacy will continue to monitor and adjust per consult.   Pernell Dupre, PharmD, BCPS Clinical Pharmacist 01/29/2017 7:33 AM

## 2017-01-29 NOTE — Progress Notes (Signed)
SUBJECTIVE: Patient denies any chest pain or shortness of breath   Vitals:   01/28/17 1628 01/28/17 2001 01/29/17 0357 01/29/17 0736  BP: (!) 100/59 (!) 96/57 (!) 97/59 (!) 101/56  Pulse: 99 98 (!) 108 (!) 116  Resp: 12 18 18 15   Temp: 98.3 F (36.8 C) 98.7 F (37.1 C) 98.8 F (37.1 C) 99.2 F (37.3 C)  TempSrc: Oral Oral Oral Oral  SpO2: 97% 99% 97% 96%  Weight:   143 lb 4.8 oz (65 kg)   Height:        Intake/Output Summary (Last 24 hours) at 01/29/2017 0828 Last data filed at 01/28/2017 2202 Gross per 24 hour  Intake 250 ml  Output 1250 ml  Net -1000 ml    LABS: Basic Metabolic Panel: Recent Labs    01/27/17 0348 01/28/17 0414 01/29/17 0347  NA 132* 138 136  K 3.2* 3.8 3.8  CL 103 111 108  CO2 24 22 21*  GLUCOSE 168* 138* 116*  BUN 8 10 10   CREATININE 0.62 0.77 0.75  CALCIUM 9.1 9.2 9.6  MG 2.0 2.1  --   PHOS 1.9* 1.6* 2.4*   Liver Function Tests: No results for input(s): AST, ALT, ALKPHOS, BILITOT, PROT, ALBUMIN in the last 72 hours. No results for input(s): LIPASE, AMYLASE in the last 72 hours. CBC: Recent Labs    01/28/17 0910 01/29/17 0347  WBC 7.3 6.7  NEUTROABS 5.3  --   HGB 9.6* 9.7*  HCT 29.2* 29.0*  MCV 77.6* 78.9*  PLT 187 182   Cardiac Enzymes: No results for input(s): CKTOTAL, CKMB, CKMBINDEX, TROPONINI in the last 72 hours. BNP: Invalid input(s): POCBNP D-Dimer: No results for input(s): DDIMER in the last 72 hours. Hemoglobin A1C: No results for input(s): HGBA1C in the last 72 hours. Fasting Lipid Panel: Recent Labs    01/27/17 0348  CHOL 151  HDL 50  LDLCALC 83  TRIG 89  CHOLHDL 3.0   Thyroid Function Tests: No results for input(s): TSH, T4TOTAL, T3FREE, THYROIDAB in the last 72 hours.  Invalid input(s): FREET3 Anemia Panel: No results for input(s): VITAMINB12, FOLATE, FERRITIN, TIBC, IRON, RETICCTPCT in the last 72 hours.   PHYSICAL EXAM General: Well developed, well nourished, in no acute distress HEENT:   Normocephalic and atramatic Neck:  No JVD.  Lungs: Clear bilaterally to auscultation and percussion. Heart: HRRR . Normal S1 and S2 without gallops or murmurs.  Abdomen: Bowel sounds are positive, abdomen soft and non-tender  Msk:  Back normal, normal gait. Normal strength and tone for age. Extremities: No clubbing, cyanosis or edema.   Neuro: Alert and oriented X 3. Psych:  Good affect, responds appropriately  TELEMETRY: Sinus tachycardia but right now sinus rhythm 98 bpm  ASSESSMENT AND PLAN: Status post STEMI with occluded large obtuse marginal and the peak troponin 19 initially presented with acute coronary syndrome and then had in-house STEMI. Blood pressure was borderline and because of hemoglobin dropping from 12.5-9.7 but does not meet the criteria of transfusion and is remaining stable. So far cultures are negative. Low-grade fever. Digoxin was added and heart rate is better and blood pressure is also improving. May plan on discharging the patient tomorrow morning if everything continues to improve.  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

## 2017-01-30 LAB — FERRITIN: Ferritin: 234 ng/mL (ref 11–307)

## 2017-01-30 LAB — FOLATE: Folate: 13.1 ng/mL (ref >5.9)

## 2017-01-30 LAB — GLUCOSE, CAPILLARY
GLUCOSE-CAPILLARY: 119 mg/dL — AB (ref 65–99)
Glucose-Capillary: 114 mg/dL — ABNORMAL HIGH (ref 65–99)

## 2017-01-30 LAB — MAGNESIUM: MAGNESIUM: 1.8 mg/dL (ref 1.7–2.4)

## 2017-01-30 LAB — IRON AND TIBC
Iron: 36 ug/dL (ref 28–170)
Saturation Ratios: 12 % (ref 10.4–31.8)
TIBC: 295 ug/dL (ref 250–450)
UIBC: 259 ug/dL

## 2017-01-30 LAB — CORTISOL: CORTISOL PLASMA: 10 ug/dL

## 2017-01-30 LAB — PHOSPHORUS: Phosphorus: 3.1 mg/dL (ref 2.5–4.6)

## 2017-01-30 LAB — VITAMIN B12: VITAMIN B 12: 742 pg/mL (ref 180–914)

## 2017-01-30 LAB — RETICULOCYTES
RBC.: 3.71 MIL/uL — AB (ref 3.80–5.20)
RETIC COUNT ABSOLUTE: 107.6 10*3/uL (ref 19.0–183.0)
Retic Ct Pct: 2.9 % (ref 0.4–3.1)

## 2017-01-30 LAB — POTASSIUM: Potassium: 3.8 mmol/L (ref 3.5–5.1)

## 2017-01-30 MED ORDER — MIDODRINE HCL 10 MG PO TABS: 10.0000 mg | ORAL_TABLET | Freq: Three times a day (TID) | ORAL | 0 refills | Status: DC

## 2017-01-30 MED ORDER — ROSUVASTATIN CALCIUM 40 MG PO TABS: 40.0000 mg | ORAL_TABLET | Freq: Every evening | ORAL | 0 refills | Status: DC

## 2017-01-30 MED ORDER — DIGOXIN 250 MCG PO TABS
0.2500 mg | ORAL_TABLET | Freq: Every day | ORAL | Status: DC
  Administered 2017-01-30: 0.25 mg via ORAL
  Filled 2017-01-30: qty 1

## 2017-01-30 MED ORDER — TICAGRELOR 90 MG PO TABS: 90.0000 mg | ORAL_TABLET | Freq: Two times a day (BID) | ORAL | 2 refills | Status: DC

## 2017-01-30 MED ORDER — NITROGLYCERIN 0.4 MG SL SUBL: 0.4000 mg | SUBLINGUAL_TABLET | SUBLINGUAL | 12 refills | Status: DC | PRN

## 2017-01-30 MED ORDER — METOPROLOL TARTRATE 25 MG PO TABS: 12.5000 mg | ORAL_TABLET | Freq: Two times a day (BID) | ORAL | Status: DC

## 2017-01-30 MED ORDER — METOPROLOL TARTRATE 25 MG PO TABS: 12.5000 mg | ORAL_TABLET | Freq: Two times a day (BID) | ORAL | 11 refills | Status: DC

## 2017-01-30 MED ORDER — METOPROLOL TARTRATE 25 MG PO TABS: 12.5000 mg | ORAL_TABLET | Freq: Two times a day (BID) | ORAL | 0 refills | Status: DC

## 2017-01-30 MED ORDER — METOPROLOL TARTRATE 25 MG PO TABS
12.5000 mg | ORAL_TABLET | Freq: Two times a day (BID) | ORAL | Status: DC
  Filled 2017-01-30: qty 1

## 2017-01-30 MED ORDER — DIGOXIN 250 MCG PO TABS: 0.2500 mg | ORAL_TABLET | Freq: Every day | ORAL | 0 refills | Status: DC

## 2017-01-30 NOTE — Discharge Summary (Signed)
Jenna Boyer at [facility], 52 y.o., DOB 01/18/1966, MRN [id number]. Admission date: 01/25/2017 Discharge Date 01/30/2017 Primary MD Patient, No Pcp Per Admitting Physician Lance Coon, MD  Admission Diagnosis  Abnormal EKG [R94.31] Chest pain, unspecified type [R07.9] STEMI (ST elevation myocardial infarction) [facility]) [I21.3]  Discharge Diagnosis   Principal Problem: Acute ST MI Hypotension Sinus tachycardia Hyperlipidemia Anemia    Hospital Course Patient is a 52 year old African-American female who presented with chest pain.  Initially requiring nitroglycerin drip.  Initial EKG was nonrevealing.  However patient started having chest pain after being admitted repeat EKG showed ST elevation therefore code STEMI was called patient underwent a cardiac catheterization and was noted to have circumflex lesion that was stented.  Postprocedure patient started having elevated heart rate and hypotension.  Patient had to be started on midodrine as well as low-dose metoprolol.  Patient's blood pressure continues to be low however she is asymptomatic.  Cardiology feels that there may have been right ventricular affected causing the low blood pressure.  Due to collaterals from the circumflex.  Patient feeling very well she is not complaining of anything currently has been ambulated.  I have instructed her to keep a log of her blood pressure 3 times a day.  If she starts feeling dizzy or any other complaints that bothers her she needs to return back to the emergency room.            Consults  cardiology Significant Tests:  See full reports for all details    Dg Chest 2 View  Result Date: 01/28/2017 CLINICAL DATA:  Shortness of breath, recent myocardial infarction. EXAM: CHEST  2 VIEW COMPARISON:  01/26/2017. FINDINGS: Trachea is midline. Heart size stable. Mild residual bibasilar airspace opacification and small bilateral pleural effusions. IMPRESSION: 1.  Improving pulmonary edema with probable bibasilar atelectasis. 2. Small bilateral pleural effusions. Electronically Signed   By: Lorin Picket M.D.   On: 01/28/2017 09:56   Dg Chest Port 1 View  Result Date: 01/26/2017 CLINICAL DATA:  Acute onset of cough after receiving bolus of fluid. Recent myocardial infarction. EXAM: PORTABLE CHEST 1 VIEW COMPARISON:  Chest radiograph performed 01/25/2017 FINDINGS: Bilateral airspace opacification, more prominent on the right, raises concern for pulmonary edema. Small right pleural effusion is noted. No pneumothorax is seen. The heart is at silhouette is borderline enlarged. No acute osseous abnormalities are identified. IMPRESSION: Bilateral airspace opacification, more prominent on the right, raises concern for new pulmonary edema. Small right pleural effusion noted. Borderline cardiomegaly. Electronically Signed   By: Garald Balding M.D.   On: 01/26/2017 23:24   Dg Chest Portable 1 View  Result Date: 01/25/2017 CLINICAL DATA:  Chest pain and shortness of Breath EXAM: PORTABLE CHEST 1 VIEW COMPARISON:  None. FINDINGS: The heart size and mediastinal contours are within normal limits. Both lungs are clear. The visualized skeletal structures are unremarkable. IMPRESSION: No active disease. Electronically Signed   By: Kerby Moors M.D.   On: 01/25/2017 13:16       Today   Subjective:   [name] patient feeling much better denies any chest pain or shortness of breath  Objective:   Blood pressure (!) 98/53, pulse 82, temperature 98.5 F (36.9 C), temperature source Oral, resp. rate 16, height 5\' 1"  (1.549 m), weight 142 lb 3.2 oz (64.5 kg), SpO2 98 %.  .  Intake/Output Summary (Last 24 hours) at 01/30/2017 1751 Last data filed at 01/30/2017 1000 Gross per 24 hour  Intake 240 ml  Output -  Net 240 ml    Exam VITAL SIGNS: Blood pressure (!) 98/53, pulse 82, temperature 98.5 F (36.9 C), temperature source Oral, resp. rate 16, height 5\' 1"   (1.549 m), weight 142 lb 3.2 oz (64.5 kg), SpO2 98 %.  GENERAL:  52 y.o.-year-old patient lying in the bed with no acute distress.  EYES: Pupils equal, round, reactive to light and accommodation. No scleral icterus. Extraocular muscles intact.  HEENT: Head atraumatic, normocephalic. Oropharynx and nasopharynx clear.  NECK:  Supple, no jugular venous distention. No thyroid enlargement, no tenderness.  LUNGS: Normal breath sounds bilaterally, no wheezing, rales,rhonchi or crepitation. No use of accessory muscles of respiration.  CARDIOVASCULAR: S1, S2 normal. No murmurs, rubs, or gallops.  ABDOMEN: Soft, nontender, nondistended. Bowel sounds present. No organomegaly or mass.  EXTREMITIES: No pedal edema, cyanosis, or clubbing.  NEUROLOGIC: Cranial nerves II through XII are intact. Muscle strength 5/5 in all extremities. Sensation intact. Gait not checked.  PSYCHIATRIC: The patient is alert and oriented x 3.  SKIN: No obvious rash, lesion, or ulcer.   Data Review     CBC w Diff:  Lab Results  Component Value Date   WBC 6.3 01/29/2017   HGB 9.5 (L) 01/29/2017   HCT 29.0 (L) 01/29/2017   PLT 201 01/29/2017   LYMPHOPCT 15 01/29/2017   MONOPCT 11 01/29/2017   EOSPCT 3 01/29/2017   BASOPCT 1 01/29/2017   CMP:  Lab Results  Component Value Date   NA 136 01/29/2017   K 3.8 01/30/2017   CL 108 01/29/2017   CO2 21 (L) 01/29/2017   BUN 10 01/29/2017   CREATININE 0.75 01/29/2017   PROT 6.6 01/26/2017   ALBUMIN 3.8 01/26/2017   BILITOT 1.0 01/26/2017   ALKPHOS 65 01/26/2017   AST 92 (H) 01/26/2017   ALT 29 01/26/2017  .  Micro Results Recent Results (from the past 240 hour(s))  MRSA PCR Screening     Status: None   Collection Time: 01/26/17 12:41 AM  Result Value Ref Range Status   MRSA by PCR NEGATIVE NEGATIVE Final    Comment:        The GeneXpert MRSA Assay (FDA approved for NASAL specimens only), is one component of a comprehensive MRSA colonization surveillance  program. It is not intended to diagnose MRSA infection nor to guide or monitor treatment for MRSA infections. Performed at [facility], [address] [place], [place] [address]   Culture, blood (Routine X 2) w Reflex to ID Panel     Status: None (Preliminary result)   Collection Time: 01/27/17 10:16 PM  Result Value Ref Range Status   Specimen Description BLOOD RT HAND  Final   Special Requests   Final    BOTTLES DRAWN AEROBIC AND ANAEROBIC Blood Culture results may not be optimal due to an excessive volume of blood received in culture bottles   Culture   Final    NO GROWTH 3 DAYS Performed at [facility], [address], [place], [place] [address]    Report Status PENDING  Incomplete  Culture, blood (Routine X 2) w Reflex to ID Panel     Status: None (Preliminary result)   Collection Time: 01/27/17 10:16 PM  Result Value Ref Range Status   Specimen Description BLOOD LT HAND  Final   Special Requests   Final    BOTTLES DRAWN AEROBIC AND ANAEROBIC Blood Culture results may not be optimal due to an excessive volume of  blood received in culture bottles   Culture   Final    NO GROWTH 3 DAYS Performed at [facility], [address] [place], [place] [address]    Report Status PENDING  Incomplete     Code Status History    Date Active Date Inactive Code Status Order ID Comments User Context   01/25/2017 15:13 01/30/2017 16:51 Full Code [id number]  Idelle Crouch, MD Inpatient          Follow-up Information    Dionisio David, MD. Go on 02/03/2017.   Specialty:  Cardiology Why:  Appointment Time: 2:45pm Contact information: [place] [address] [phone]           Discharge Medications   Allergies as of 01/30/2017   No Known Allergies     Medication List    STOP taking these medications   losartan 100 MG tablet Commonly known as:  COZAAR     TAKE these medications   aspirin EC 81 MG tablet Take 81 mg by  mouth daily.   digoxin 0.25 MG tablet Commonly known as:  LANOXIN Take 1 tablet (0.25 mg total) by mouth daily. Notes to patient:  Take your pulse prior to taking digoxin.  If your pulse is 60 beats per minute or lower, don't take the digoxin for that day   fexofenadine 180 MG tablet Commonly known as:  ALLEGRA Take 180 mg by mouth daily.   metoprolol tartrate 25 MG tablet Commonly known as:  LOPRESSOR Take 0.5 tablets (12.5 mg total) by mouth 2 (two) times daily. Hold if sbp<100 or heart rate less than 50 Notes to patient:  Do not take Metoprolol is your upper BP number is 100 or less.   metoprolol tartrate 25 MG tablet Commonly known as:  LOPRESSOR Take 0.5 tablets (12.5 mg total) by mouth 2 (two) times daily.   midodrine 10 MG tablet Commonly known as:  PROAMATINE Take 1 tablet (10 mg total) by mouth 3 (three) times daily with meals.   nitroGLYCERIN 0.4 MG SL tablet Commonly known as:  NITROSTAT Place 1 tablet (0.4 mg total) under the tongue every 5 (five) minutes as needed for chest pain. Notes to patient:  Only take if you have chest pain   rosuvastatin 40 MG tablet Commonly known as:  CRESTOR Take 1 tablet (40 mg total) by mouth every evening. What changed:    medication strength  how much to take   ticagrelor 90 MG Tabs tablet Commonly known as:  BRILINTA Take 1 tablet (90 mg total) by mouth 2 (two) times daily.          Total Time in preparing paper work, data evaluation and todays exam - 35 minutes  Dustin Flock M.D on 01/30/2017 at 5:51 PM  [facility]   Office  [phone]

## 2017-01-30 NOTE — Plan of Care (Signed)
She has received education from several disciplines.  On-going education will occur with physician and cardiac rehab

## 2017-01-30 NOTE — Progress Notes (Signed)
Discharged to home with family members.  Gave her written information about her meds and described what they do, and how and when they are to be taken.  Provided resources for a heart healthy diet.  She will be scheduled with Cardiac Rehab next week.

## 2017-01-30 NOTE — Progress Notes (Signed)
SUBJECTIVE: Patient denies any chest pain shortness of breath or dizziness   Vitals:   01/30/17 0500 01/30/17 0548 01/30/17 0549 01/30/17 0558  BP:  (!) 84/46 (!) 89/45 (!) 92/55  Pulse:  86 90 87  Resp:      Temp:  98.7 F (37.1 C)    TempSrc:  Oral    SpO2:  98%    Weight: 142 lb 3.2 oz (64.5 kg)     Height:        Intake/Output Summary (Last 24 hours) at 01/30/2017 0832 Last data filed at 01/29/2017 1851 Gross per 24 hour  Intake 243 ml  Output -  Net 243 ml    LABS: Basic Metabolic Panel: Recent Labs    01/28/17 0414 01/29/17 0347 01/30/17 0431  NA 138 136  --   K 3.8 3.8 3.8  CL 111 108  --   CO2 22 21*  --   GLUCOSE 138* 116*  --   BUN 10 10  --   CREATININE 0.77 0.75  --   CALCIUM 9.2 9.6  --   MG 2.1  --  1.8  PHOS 1.6* 2.4* 3.1   Liver Function Tests: No results for input(s): AST, ALT, ALKPHOS, BILITOT, PROT, ALBUMIN in the last 72 hours. No results for input(s): LIPASE, AMYLASE in the last 72 hours. CBC: Recent Labs    01/28/17 0910 01/29/17 0347 01/29/17 0857  WBC 7.3 6.7 6.3  NEUTROABS 5.3  --  4.5  HGB 9.6* 9.7* 9.5*  HCT 29.2* 29.0* 29.0*  MCV 77.6* 78.9* 79.0*  PLT 187 182 201   Cardiac Enzymes: No results for input(s): CKTOTAL, CKMB, CKMBINDEX, TROPONINI in the last 72 hours. BNP: Invalid input(s): POCBNP D-Dimer: No results for input(s): DDIMER in the last 72 hours. Hemoglobin A1C: No results for input(s): HGBA1C in the last 72 hours. Fasting Lipid Panel: No results for input(s): CHOL, HDL, LDLCALC, TRIG, CHOLHDL, LDLDIRECT in the last 72 hours. Thyroid Function Tests: No results for input(s): TSH, T4TOTAL, T3FREE, THYROIDAB in the last 72 hours.  Invalid input(s): FREET3 Anemia Panel: No results for input(s): VITAMINB12, FOLATE, FERRITIN, TIBC, IRON, RETICCTPCT in the last 72 hours.   PHYSICAL EXAM General: Well developed, well nourished, in no acute distress HEENT:  Normocephalic and atramatic Neck:  No JVD.  Lungs: Clear  bilaterally to auscultation and percussion. Heart: HRRR . Normal S1 and S2 without gallops or murmurs.  Abdomen: Bowel sounds are positive, abdomen soft and non-tender  Msk:  Back normal, normal gait. Normal strength and tone for age. Extremities: No clubbing, cyanosis or edema.   Neuro: Alert and oriented X 3. Psych:  Good affect, responds appropriately  TELEMETRY: Sinus rhythm about 87 bpm  ASSESSMENT AND PLAN: Status post STEMI with labile blood pressure right now about 92 systolic but does not feel dizzy. Discussed with Dr. Posey Pronto and will decrease metoprolol further and continue digoxin as may have had right ventricular infarct. Clinically she is feeling much better and may advise walking her around and if she feels comfortable can when on discharging either today or tomorrow. Will follow-up Tuesday next week at 10:00 in my office.  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

## 2017-01-30 NOTE — Discharge Instructions (Addendum)
[place] at [place]:  Cardiac diet  DISCHARGE CONDITION:  Stable  ACTIVITY:  Activity as tolerated  OXYGEN:  Home Oxygen: No.   Oxygen Delivery: room air  DISCHARGE LOCATION:  home    ADDITIONAL DISCHARGE INSTRUCTION:  MI / NSTEMI Discharge Checklist  Aspirin prescribed at discharge:   Yes   High Intensity Statin Prescribed? (Lipitor 40-80mg  or Crestor 20-40mg ):Yes   Beta Blocker Prescribed: Yes   ADP Receptor Inhibitor Prescribed? (i.e. Plavix etc.- Includes Medically Managed Patients): Yes   Was EF assessed during THIS hospitalization? Yes  (YES = Measured in current episode of care or document plan to evaluate after discharge.)  Was EF < 40%?   no For EF < 40%, was ACE/ARB prescribed?  For EF < 40%, was Aldosterone Inhibitor prescribed?   (If contraindicated, provide explanation.)    Was Cardiac Rehab Phase II ordered prior to discharge? (Includes Medically Managed Patients): Yes  If you experience worsening of your admission symptoms, develop shortness of breath, life threatening emergency, suicidal or homicidal thoughts you must seek medical attention immediately by calling 911 or calling your MD immediately  if symptoms less severe.  You Must read complete instructions/literature along with all the possible adverse reactions/side effects for all the Medicines you take and that have been prescribed to you. Take any new Medicines after you have completely understood and accpet all the possible adverse reactions/side effects.   Please note  You were cared for by a hospitalist during your hospital stay. If you have any questions about your discharge medications or the care you received while you were in the hospital after you are discharged, you can call the unit and asked to speak with the hospitalist on call if the hospitalist that took care of you is not available. Once you are discharged, your primary care physician will  handle any further medical issues. Please note that NO REFILLS for any discharge medications will be authorized once you are discharged, as it is imperative that you return to your primary care physician (or establish a relationship with a primary care physician if you do not have one) for your aftercare needs so that they can reassess your need for medications and monitor your lab values.

## 2017-02-01 LAB — CULTURE, BLOOD (ROUTINE X 2)
Culture: NO GROWTH
Culture: NO GROWTH

## 2017-02-23 ENCOUNTER — Encounter: Payer: Self-pay | Admitting: *Deleted

## 2017-02-23 ENCOUNTER — Encounter: Payer: BLUE CROSS/BLUE SHIELD | Attending: Cardiovascular Disease | Admitting: *Deleted

## 2017-02-23 VITALS — Ht 62.1 in | Wt 136.1 lb

## 2017-02-23 DIAGNOSIS — I10 Essential (primary) hypertension: Secondary | ICD-10-CM | POA: Insufficient documentation

## 2017-02-23 DIAGNOSIS — Z79899 Other long term (current) drug therapy: Secondary | ICD-10-CM | POA: Insufficient documentation

## 2017-02-23 DIAGNOSIS — I252 Old myocardial infarction: Secondary | ICD-10-CM | POA: Insufficient documentation

## 2017-02-23 DIAGNOSIS — Z7982 Long term (current) use of aspirin: Secondary | ICD-10-CM | POA: Diagnosis not present

## 2017-02-23 DIAGNOSIS — Z955 Presence of coronary angioplasty implant and graft: Secondary | ICD-10-CM | POA: Insufficient documentation

## 2017-02-23 DIAGNOSIS — Z7902 Long term (current) use of antithrombotics/antiplatelets: Secondary | ICD-10-CM | POA: Insufficient documentation

## 2017-02-23 DIAGNOSIS — E785 Hyperlipidemia, unspecified: Secondary | ICD-10-CM | POA: Insufficient documentation

## 2017-02-23 DIAGNOSIS — I213 ST elevation (STEMI) myocardial infarction of unspecified site: Secondary | ICD-10-CM

## 2017-02-23 NOTE — Progress Notes (Signed)
Cardiac Individual Treatment Plan  Patient Details  Name: Jenna Boyer MRN: [id number] Date of Birth: 1965/11/07 Referring Provider:     Cardiac Rehab from 02/23/2017 in [facility] Cardiac and Pulmonary Rehab  Referring Provider  Neoma Laming MD      Initial Encounter Date:    Cardiac Rehab from 02/23/2017 in [facility] Cardiac and Pulmonary Rehab  Date  02/23/17  Referring Provider  Neoma Laming MD      Visit Diagnosis: ST elevation myocardial infarction (STEMI), unspecified artery [facility])  Status post coronary artery stent placement  Patient's Home Medications on Admission:  Current Outpatient Medications:  .  aspirin EC 81 MG tablet, Take 81 mg by mouth daily., Disp: , Rfl:  .  digoxin (LANOXIN) 0.25 MG tablet, Take 1 tablet (0.25 mg total) by mouth daily., Disp: 30 tablet, Rfl: 0 .  fexofenadine (ALLEGRA) 180 MG tablet, Take 180 mg by mouth daily., Disp: , Rfl:  .  metoprolol succinate (TOPROL-XL) 50 MG 24 hr tablet, TAKE 1 TABLET BY MOUTH EVERY DAY--NEW DIRECTIONS, Disp: , Rfl: 1 .  midodrine (PROAMATINE) 5 MG tablet, TAKE 1 TABLET BY MOUTH ONCE DAILY. NOTE DOSE DECREASE., Disp: , Rfl: 1 .  nitroGLYCERIN (NITROSTAT) 0.4 MG SL tablet, Place 1 tablet (0.4 mg total) under the tongue every 5 (five) minutes as needed for chest pain., Disp: 10 tablet, Rfl: 12 .  rosuvastatin (CRESTOR) 40 MG tablet, Take 1 tablet (40 mg total) by mouth every evening., Disp: 30 tablet, Rfl: 0 .  ticagrelor (BRILINTA) 90 MG TABS tablet, Take 1 tablet (90 mg total) by mouth 2 (two) times daily., Disp: 60 tablet, Rfl: 2 .  metoprolol tartrate (LOPRESSOR) 25 MG tablet, Take 0.5 tablets (12.5 mg total) by mouth 2 (two) times daily. Hold if sbp<100 or heart rate less than 50 (Patient not taking: Reported on 02/23/2017), Disp: 30 tablet, Rfl: 11 .  metoprolol tartrate (LOPRESSOR) 25 MG tablet, Take 0.5 tablets (12.5 mg total) by mouth 2 (two) times daily. (Patient not taking: Reported on 02/23/2017), Disp: 30 tablet, Rfl:  0 .  midodrine (PROAMATINE) 10 MG tablet, Take 1 tablet (10 mg total) by mouth 3 (three) times daily with meals. (Patient not taking: Reported on 02/23/2017), Disp: 90 tablet, Rfl: 0  Past Medical History: Past Medical History:  Diagnosis Date  . Hyperlipemia   . Hypertension     Tobacco Use: Social History   Tobacco Use  Smoking Status Never Smoker  Smokeless Tobacco Never Used    Labs: Recent Review Flowsheet Data    Labs for ITP Cardiac and Pulmonary Rehab Latest Ref Rng & Units 01/25/2017 01/27/2017   Cholestrol 0 - 200 mg/dL - 151   LDLCALC 0 - 99 mg/dL - 83   HDL >40 mg/dL - 50   Trlycerides <150 mg/dL - 89   Hemoglobin A1c 4.8 - 5.6 % 6.2(H) -       Exercise Target Goals: Date: 02/23/17  Exercise Program Goal: Individual exercise prescription set using results from initial 6 min walk test and THRR while considering  patient's activity barriers and safety.   Exercise Prescription Goal: Initial exercise prescription builds to 30-45 minutes a day of aerobic activity, 2-3 days per week.  Home exercise guidelines will be given to patient during program as part of exercise prescription that the participant will acknowledge.  Activity Barriers & Risk Stratification: Activity Barriers & Cardiac Risk Stratification - 02/23/17 1316      Activity Barriers & Cardiac Risk Stratification   Activity Barriers  None    Cardiac Risk Stratification  Moderate       6 Minute Walk: 6 Minute Walk    Row Name 02/23/17 1442         6 Minute Walk   Phase  Initial     Distance  1230 feet     Walk Time  6 minutes     # of Rest Breaks  0     MPH  2.33     METS  4.01     RPE  13     VO2 Peak  14.03     Symptoms  No     Resting HR  64 bpm     Resting BP  126/74     Resting Oxygen Saturation   99 %     Exercise Oxygen Saturation  during 6 min walk  100 %     Max Ex. HR  97 bpm     Max Ex. BP  142/64     2 Minute Post BP  132/70        Oxygen Initial  Assessment:   Oxygen Re-Evaluation:   Oxygen Discharge (Final Oxygen Re-Evaluation):   Initial Exercise Prescription: Initial Exercise Prescription - 02/23/17 1400      Date of Initial Exercise RX and Referring Provider   Date  02/23/17    Referring Provider  Neoma Laming MD      Treadmill   MPH  2.3    Grade  3    Minutes  15    METs  3.71      NuStep   Level  4    SPM  80    Minutes  15    METs  3.7      Elliptical   Level  1    Speed  3.9    Minutes  15      Prescription Details   Frequency (times per week)  3    Duration  Progress to 45 minutes of aerobic exercise without signs/symptoms of physical distress      Intensity   THRR 40-80% of Max Heartrate  107-150    Ratings of Perceived Exertion  11-13    Perceived Dyspnea  0-4      Progression   Progression  Continue to progress workloads to maintain intensity without signs/symptoms of physical distress.      Resistance Training   Training Prescription  Yes    Weight  4 lbs    Reps  10-15       Perform Capillary Blood Glucose checks as needed.  Exercise Prescription Changes: Exercise Prescription Changes    Row Name 02/23/17 1400             Response to Exercise   Blood Pressure (Admit)  126/74       Blood Pressure (Exercise)  142/64       Blood Pressure (Exit)  132/70       Heart Rate (Admit)  64 bpm       Heart Rate (Exercise)  97 bpm       Heart Rate (Exit)  66 bpm       Oxygen Saturation (Admit)  99 %       Oxygen Saturation (Exercise)  100 %       Rating of Perceived Exertion (Exercise)  13       Symptoms  none       Comments  walk test results  Exercise Comments:   Exercise Goals and Review: Exercise Goals    Row Name 02/23/17 1449             Exercise Goals   Increase Physical Activity  Yes       Intervention  Provide advice, education, support and counseling about physical activity/exercise needs.;Develop an individualized exercise prescription for aerobic  and resistive training based on initial evaluation findings, risk stratification, comorbidities and participant's personal goals.       Expected Outcomes  Short Term: Attend rehab on a regular basis to increase amount of physical activity.;Long Term: Add in home exercise to make exercise part of routine and to increase amount of physical activity.;Long Term: Exercising regularly at least 3-5 days a week.       Increase Strength and Stamina  Yes       Intervention  Provide advice, education, support and counseling about physical activity/exercise needs.;Develop an individualized exercise prescription for aerobic and resistive training based on initial evaluation findings, risk stratification, comorbidities and participant's personal goals.       Expected Outcomes  Short Term: Increase workloads from initial exercise prescription for resistance, speed, and METs.;Short Term: Perform resistance training exercises routinely during rehab and add in resistance training at home;Long Term: Improve cardiorespiratory fitness, muscular endurance and strength as measured by increased METs and functional capacity (6MWT)       Able to understand and use rate of perceived exertion (RPE) scale  Yes       Intervention  Provide education and explanation on how to use RPE scale       Expected Outcomes  Short Term: Able to use RPE daily in rehab to express subjective intensity level;Long Term:  Able to use RPE to guide intensity level when exercising independently       Knowledge and understanding of Target Heart Rate Range (THRR)  Yes       Intervention  Provide education and explanation of THRR including how the numbers were predicted and where they are located for reference       Expected Outcomes  Short Term: Able to state/look up THRR;Long Term: Able to use THRR to govern intensity when exercising independently;Short Term: Able to use daily as guideline for intensity in rehab       Able to check pulse independently  Yes        Intervention  Provide education and demonstration on how to check pulse in carotid and radial arteries.;Review the importance of being able to check your own pulse for safety during independent exercise       Expected Outcomes  Short Term: Able to explain why pulse checking is important during independent exercise;Long Term: Able to check pulse independently and accurately       Understanding of Exercise Prescription  Yes       Intervention  Provide education, explanation, and written materials on patient's individual exercise prescription       Expected Outcomes  Short Term: Able to explain program exercise prescription;Long Term: Able to explain home exercise prescription to exercise independently          Exercise Goals Re-Evaluation :   Discharge Exercise Prescription (Final Exercise Prescription Changes): Exercise Prescription Changes - 02/23/17 1400      Response to Exercise   Blood Pressure (Admit)  126/74    Blood Pressure (Exercise)  142/64    Blood Pressure (Exit)  132/70    Heart Rate (Admit)  64 bpm    Heart Rate (  Exercise)  97 bpm    Heart Rate (Exit)  66 bpm    Oxygen Saturation (Admit)  99 %    Oxygen Saturation (Exercise)  100 %    Rating of Perceived Exertion (Exercise)  13    Symptoms  none    Comments  walk test results       Nutrition:  Target Goals: Understanding of nutrition guidelines, daily intake of sodium <1579m, cholesterol <2062m calories 30% from fat and 7% or less from saturated fats, daily to have 5 or more servings of fruits and vegetables.  Biometrics: Pre Biometrics - 02/23/17 1449      Pre Biometrics   Height  5' 2.1" (1.577 m)    Weight  136 lb 1.6 oz (61.7 kg)    Waist Circumference  30 inches    Hip Circumference  37 inches    Waist to Hip Ratio  0.81 %    BMI (Calculated)  24.82    Single Leg Stand  24.57 seconds        Nutrition Therapy Plan and Nutrition Goals: Nutrition Therapy & Goals - 02/23/17 1311       Intervention Plan   Intervention  Prescribe, educate and counsel regarding individualized specific dietary modifications aiming towards targeted core components such as weight, hypertension, lipid management, diabetes, heart failure and other comorbidities.;Nutrition handout(s) given to patient.    Expected Outcomes  Short Term Goal: Understand basic principles of dietary content, such as calories, fat, sodium, cholesterol and nutrients.;Short Term Goal: A plan has been developed with personal nutrition goals set during dietitian appointment.;Long Term Goal: Adherence to prescribed nutrition plan.       Nutrition Assessments: Nutrition Assessments - 02/23/17 1052      MEDFICTS Scores   Pre Score  60       Nutrition Goals Re-Evaluation:   Nutrition Goals Discharge (Final Nutrition Goals Re-Evaluation):   Psychosocial: Target Goals: Acknowledge presence or absence of significant depression and/or stress, maximize coping skills, provide positive support system. Participant is able to verbalize types and ability to use techniques and skills needed for reducing stress and depression.   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 She has been caring for her mom for 7 years and started right after her own husband passed away. Her mother's health is steadily declining and it is hard to focus on her own health after this STEMI.        Family Dynamics   Good Support System?  -- friends and pastor      Screening Interventions   Interventions  Encouraged to exercise;Program counselor consult    Expected Outcomes  Short Term goal: Utilizing psychosocial counselor, staff and physician to assist with identification of specific Stressors or current issues interfering  with healing process. Setting desired goal for each stressor or current issue identified.;Long Term Goal: Stressors or current issues are controlled or eliminated.;Short Term goal: Identification and review with participant of any Quality of Life or Depression concerns found by scoring the questionnaire.;Long Term goal: The participant improves quality of Life and PHQ9 Scores as seen by post scores and/or verbalization of changes       Quality of Life Scores:  Quality of Life - 02/23/17  1048      Quality of Life Scores   Health/Function Pre  20.4 %    Socioeconomic Pre  20.63 %    Psych/Spiritual Pre  24.64 %    Family Pre  8.8 %    GLOBAL Pre  19.64 %      Scores of 19 and below usually indicate a poorer quality of life in these areas.  A difference of  2-3 points is a clinically meaningful difference.  A difference of 2-3 points in the total score of the Quality of Life Index has been associated with significant improvement in overall quality of life, self-image, physical symptoms, and general health in studies assessing change in quality of life.  PHQ-9: Recent Review Flowsheet Data    Depression screen [facility] 2/9 02/23/2017   Decreased Interest 0   Down, Depressed, Hopeless 0   PHQ - 2 Score 0   Altered sleeping 0   Tired, decreased energy 1   Change in appetite 0   Feeling bad or failure about yourself  0   Trouble concentrating 0   Moving slowly or fidgety/restless 0   Suicidal thoughts 0   PHQ-9 Score 1   Difficult doing work/chores Not difficult at all     Interpretation of Total Score  Total Score Depression Severity:  1-4 = Minimal depression, 5-9 = Mild depression, 10-14 = Moderate depression, 15-19 = Moderately severe depression, 20-27 = Severe depression   Psychosocial Evaluation and Intervention:   Psychosocial Re-Evaluation:   Psychosocial Discharge (Final Psychosocial Re-Evaluation):   Vocational Rehabilitation: Provide vocational rehab assistance to  qualifying candidates.   Vocational Rehab Evaluation & Intervention: Vocational Rehab - 02/23/17 1315      Initial Vocational Rehab Evaluation & Intervention   Assessment shows need for Vocational Rehabilitation  No       Education: Education Goals: Education classes will be provided on a variety of topics geared toward better understanding of heart health and risk factor modification. Participant will state understanding/return demonstration of topics presented as noted by education test scores.  Learning Barriers/Preferences: Learning Barriers/Preferences - 02/23/17 1314      Learning Barriers/Preferences   Learning Barriers  None    Learning Preferences  None       Education Topics:  AED/CPR: - Group verbal and written instruction with the use of models to demonstrate the basic use of the AED with the basic ABC's of resuscitation.   General Nutrition Guidelines/Fats and Fiber: -Group instruction provided by verbal, written material, models and posters to present the general guidelines for heart healthy nutrition. Gives an explanation and review of dietary fats and fiber.   Controlling Sodium/Reading Food Labels: -Group verbal and written material supporting the discussion of sodium use in heart healthy nutrition. Review and explanation with models, verbal and written materials for utilization of the food label.   Exercise Physiology & General Exercise Guidelines: - Group verbal and written instruction with models to review the exercise physiology of the cardiovascular system and associated critical values. Provides general exercise guidelines with specific guidelines to those with heart or lung disease.    Aerobic Exercise & Resistance Training: - Gives group verbal and written instruction on the various components of exercise. Focuses on aerobic and resistive training programs and the benefits of this training and how to safely progress through these  programs..   Flexibility, Balance, Mind/Body Relaxation: Provides group verbal/written instruction on the benefits of flexibility and balance training, including mind/body exercise modes such as yoga, pilates  and tai chi.  Demonstration and skill practice provided.   Stress and Anxiety: - Provides group verbal and written instruction about the health risks of elevated stress and causes of high stress.  Discuss the correlation between heart/lung disease and anxiety and treatment options. Review healthy ways to manage with stress and anxiety.   Depression: - Provides group verbal and written instruction on the correlation between heart/lung disease and depressed mood, treatment options, and the stigmas associated with seeking treatment.   Anatomy & Physiology of the Heart: - Group verbal and written instruction and models provide basic cardiac anatomy and physiology, with the coronary electrical and arterial systems. Review of Valvular disease and Heart Failure   Cardiac Procedures: - Group verbal and written instruction to review commonly prescribed medications for heart disease. Reviews the medication, class of the drug, and side effects. Includes the steps to properly store meds and maintain the prescription regimen. (beta blockers and nitrates)   Cardiac Medications I: - Group verbal and written instruction to review commonly prescribed medications for heart disease. Reviews the medication, class of the drug, and side effects. Includes the steps to properly store meds and maintain the prescription regimen.   Cardiac Medications II: -Group verbal and written instruction to review commonly prescribed medications for heart disease. Reviews the medication, class of the drug, and side effects. (all other drug classes)    Go Sex-Intimacy & Heart Disease, Get SMART - Goal Setting: - Group verbal and written instruction through game format to discuss heart disease and the return to sexual  intimacy. Provides group verbal and written material to discuss and apply goal setting through the application of the S.M.A.R.T. Method.   Other Matters of the Heart: - Provides group verbal, written materials and models to describe Stable Angina and Peripheral Artery. Includes description of the disease process and treatment options available to the cardiac patient.   Exercise & Equipment Safety: - Individual verbal instruction and demonstration of equipment use and safety with use of the equipment.   Cardiac Rehab from 02/23/2017 in [facility] Cardiac and Pulmonary Rehab  Date  02/23/17  Educator  [facility]  Instruction Review Code  1- Verbalizes Understanding      Infection Prevention: - Provides verbal and written material to individual with discussion of infection control including proper hand washing and proper equipment cleaning during exercise session.   Cardiac Rehab from 02/23/2017 in [facility] Cardiac and Pulmonary Rehab  Date  02/23/17  Educator  [facility]  Instruction Review Code  1- Verbalizes Understanding      Falls Prevention: - Provides verbal and written material to individual with discussion of falls prevention and safety.   Cardiac Rehab from 02/23/2017 in [facility] Cardiac and Pulmonary Rehab  Date  02/23/17  Educator  [facility]  Instruction Review Code  1- Verbalizes Understanding      Diabetes: - Individual verbal and written instruction to review signs/symptoms of diabetes, desired ranges of glucose level fasting, after meals and with exercise. Acknowledge that pre and post exercise glucose checks will be done for 3 sessions at entry of program.   Know Your Numbers and Risk Factors: -Group verbal and written instruction about important numbers in your health.  Discussion of what are risk factors and how they play a role in the disease process.  Review of Cholesterol, Blood Pressure, Diabetes, and BMI and the role they play in your overall health.   Sleep Hygiene: -Provides group verbal  and written instruction about how sleep can affect your health.  Define sleep hygiene, discuss sleep cycles and impact of sleep habits. Review good sleep hygiene tips.    Other: -Provides group and verbal instruction on various topics (see comments)   Knowledge Questionnaire Score: Knowledge Questionnaire Score - 02/23/17 1051      Knowledge Questionnaire Score   Pre Score  19/28 correct answers reviewed with Onesty       Core Components/Risk Factors/Patient Goals at Admission: Personal Goals and Risk Factors at Admission - 02/23/17 1304      Core Components/Risk Factors/Patient Goals on Admission    Weight Management  Weight Maintenance;Yes    Intervention  Weight Management: Develop a combined nutrition and exercise program designed to reach desired caloric intake, while maintaining appropriate intake of nutrient and fiber, sodium and fats, and appropriate energy expenditure required for the weight goal.;Weight Management: Provide education and appropriate resources to help participant work on and attain dietary goals.    Admit Weight  136 lb (61.7 kg) She has already lost 10 lb without really trying. She wants to maintain around 135 lb    Goal Weight: Short Term  136 lb (61.7 kg)    Goal Weight: Long Term  136 lb (61.7 kg)    Expected Outcomes  Short Term: Continue to assess and modify interventions until short term weight is achieved;Long Term: Adherence to nutrition and physical activity/exercise program aimed toward attainment of established weight goal;Weight Maintenance: Understanding of the daily nutrition guidelines, which includes 25-35% calories from fat, 7% or less cal from saturated fats, less than 262m cholesterol, less than 1.5gm of sodium, & 5 or more servings of fruits and vegetables daily;Understanding recommendations for meals to include 15-35% energy as protein, 25-35% energy from fat, 35-60% energy from carbohydrates, less than 2026mof dietary cholesterol, 20-35 gm of  total fiber daily;Understanding of distribution of calorie intake throughout the day with the consumption of 4-5 meals/snacks    Hypertension  Yes    Intervention  Provide education on lifestyle modifcations including regular physical activity/exercise, weight management, moderate sodium restriction and increased consumption of fresh fruit, vegetables, and low fat dairy, alcohol moderation, and smoking cessation.;Monitor prescription use compliance.    Expected Outcomes  Short Term: Continued assessment and intervention until BP is < 140/9070mG in hypertensive participants. < 130/42m3m in hypertensive participants with diabetes, heart failure or chronic kidney disease.;Long Term: Maintenance of blood pressure at goal levels.    Lipids  Yes    Intervention  Provide education and support for participant on nutrition & aerobic/resistive exercise along with prescribed medications to achieve LDL <70mg30mL >40mg.21mExpected Outcomes  Short Term: Participant states understanding of desired cholesterol values and is compliant with medications prescribed. Participant is following exercise prescription and nutrition guidelines.;Long Term: Cholesterol controlled with medications as prescribed, with individualized exercise RX and with personalized nutrition plan. Value goals: LDL < 70mg, 93m> 40 mg.    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 She has been caring for her mom for the past 7 years, she started right after her husband passed away. Her mother's health is steadily declining and it is hard to focus on herself.     Intervention  Offer individual and/or small group education and counseling on adjustment to heart disease, stress management and health-related lifestyle change. Teach and support self-help strategies.;Refer participants experiencing significant psychosocial distress to appropriate mental health specialists for further evaluation and treatment. When  possible, include family members  and significant others in education/counseling sessions.    Expected Outcomes  Short Term: Participant demonstrates changes in health-related behavior, relaxation and other stress management skills, ability to obtain effective social support, and compliance with psychotropic medications if prescribed.;Long Term: Emotional wellbeing is indicated by absence of clinically significant psychosocial distress or social isolation.       Core Components/Risk Factors/Patient Goals Review:    Core Components/Risk Factors/Patient Goals at Discharge (Final Review):    ITP Comments: ITP Comments    Row Name 02/23/17 1254           ITP Comments  Med Review completed. Initial ITP created. Diagnosis can be found in [facility] encounter 01/25/17          Comments: Initial ITP

## 2017-02-23 NOTE — Patient Instructions (Signed)
Patient Instructions  Patient Details  Name: Jenna Boyer MRN: [id number] Date of Birth: [birth year] Referring Provider:  Dionisio David, MD  Below are your personal goals for exercise, nutrition, and risk factors. Our goal is to help you stay on track towards obtaining and maintaining these goals. We will be discussing your progress on these goals with you throughout the program.  Initial Exercise Prescription: Initial Exercise Prescription - 02/23/17 1400      Date of Initial Exercise RX and Referring Provider   Date  02/23/17    Referring Provider  Neoma Laming MD      Treadmill   MPH  2.3    Grade  3    Minutes  15    METs  3.71      NuStep   Level  4    SPM  80    Minutes  15    METs  3.7      Elliptical   Level  1    Speed  3.9    Minutes  15      Prescription Details   Frequency (times per week)  3    Duration  Progress to 45 minutes of aerobic exercise without signs/symptoms of physical distress      Intensity   THRR 40-80% of Max Heartrate  107-150    Ratings of Perceived Exertion  11-13    Perceived Dyspnea  0-4      Progression   Progression  Continue to progress workloads to maintain intensity without signs/symptoms of physical distress.      Resistance Training   Training Prescription  Yes    Weight  4 lbs    Reps  10-15       Exercise Goals: Frequency: Be able to perform aerobic exercise two to three times per week in program working toward 2-5 days per week of home exercise.  Intensity: Work with a perceived exertion of 11 (fairly light) - 15 (hard) while following your exercise prescription.  We will make changes to your prescription with you as you progress through the program.   Duration: Be able to do 30 to 45 minutes of continuous aerobic exercise in addition to a 5 minute warm-up and a 5 minute cool-down routine.   Nutrition Goals: Your personal nutrition goals will be established when you do your nutrition analysis with the  dietician.  The following are general nutrition guidelines to follow: Cholesterol < 200mg /day Sodium < 1500mg /day Fiber: Women under 50 yrs - 25 grams per day  Personal Goals: Personal Goals and Risk Factors at Admission - 02/23/17 1304      Core Components/Risk Factors/Patient Goals on Admission    Weight Management  Weight Maintenance;Yes    Intervention  Weight Management: Develop a combined nutrition and exercise program designed to reach desired caloric intake, while maintaining appropriate intake of nutrient and fiber, sodium and fats, and appropriate energy expenditure required for the weight goal.;Weight Management: Provide education and appropriate resources to help participant work on and attain dietary goals.    Admit Weight  136 lb (61.7 kg) She has already lost 10 lb without really trying. She wants to maintain around 135 lb    Goal Weight: Short Term  136 lb (61.7 kg)    Goal Weight: Long Term  136 lb (61.7 kg)    Expected Outcomes  Short Term: Continue to assess and modify interventions until short term weight is achieved;Long Term: Adherence to nutrition and physical activity/exercise program aimed toward  attainment of established weight goal;Weight Maintenance: Understanding of the daily nutrition guidelines, which includes 25-35% calories from fat, 7% or less cal from saturated fats, less than 200mg  cholesterol, less than 1.5gm of sodium, & 5 or more servings of fruits and vegetables daily;Understanding recommendations for meals to include 15-35% energy as protein, 25-35% energy from fat, 35-60% energy from carbohydrates, less than 200mg  of dietary cholesterol, 20-35 gm of total fiber daily;Understanding of distribution of calorie intake throughout the day with the consumption of 4-5 meals/snacks    Hypertension  Yes    Intervention  Provide education on lifestyle modifcations including regular physical activity/exercise, weight management, moderate sodium restriction and  increased consumption of fresh fruit, vegetables, and low fat dairy, alcohol moderation, and smoking cessation.;Monitor prescription use compliance.    Expected Outcomes  Short Term: Continued assessment and intervention until BP is < 140/58mm HG in hypertensive participants. < 130/89mm HG in hypertensive participants with diabetes, heart failure or chronic kidney disease.;Long Term: Maintenance of blood pressure at goal levels.    Lipids  Yes    Intervention  Provide education and support for participant on nutrition & aerobic/resistive exercise along with prescribed medications to achieve LDL 70mg , HDL >40mg .    Expected Outcomes  Short Term: Participant states understanding of desired cholesterol values and is compliant with medications prescribed. Participant is following exercise prescription and nutrition guidelines.;Long Term: Cholesterol controlled with medications as prescribed, with individualized exercise RX and with personalized nutrition plan. Value goals: LDL < 70mg , HDL > 40 mg.    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 She has been caring for her mom for the past 7 years, she started right after her husband passed away. Her mother's health is steadily declining and it is hard to focus on herself.     Intervention  Offer individual and/or small group education and counseling on adjustment to heart disease, stress management and health-related lifestyle change. Teach and support self-help strategies.;Refer participants experiencing significant psychosocial distress to appropriate mental health specialists for further evaluation and treatment. When possible, include family members and significant others in education/counseling sessions.    Expected Outcomes  Short Term: Participant demonstrates changes in health-related behavior, relaxation and other stress management skills, ability to obtain effective social support, and compliance with psychotropic  medications if prescribed.;Long Term: Emotional wellbeing is indicated by absence of clinically significant psychosocial distress or social isolation.       Tobacco Use Initial Evaluation: Social History   Tobacco Use  Smoking Status Never Smoker  Smokeless Tobacco Never Used    Exercise Goals and Review: Exercise Goals    Row Name 02/23/17 1449             Exercise Goals   Increase Physical Activity  Yes       Intervention  Provide advice, education, support and counseling about physical activity/exercise needs.;Develop an individualized exercise prescription for aerobic and resistive training based on initial evaluation findings, risk stratification, comorbidities and participant's personal goals.       Expected Outcomes  Short Term: Attend rehab on a regular basis to increase amount of physical activity.;Long Term: Add in home exercise to make exercise part of routine and to increase amount of physical activity.;Long Term: Exercising regularly at least 3-5 days a week.       Increase Strength and Stamina  Yes       Intervention  Provide advice, education, support and counseling about physical activity/exercise needs.;Develop  an individualized exercise prescription for aerobic and resistive training based on initial evaluation findings, risk stratification, comorbidities and participant's personal goals.       Expected Outcomes  Short Term: Increase workloads from initial exercise prescription for resistance, speed, and METs.;Short Term: Perform resistance training exercises routinely during rehab and add in resistance training at home;Long Term: Improve cardiorespiratory fitness, muscular endurance and strength as measured by increased METs and functional capacity (6MWT)       Able to understand and use rate of perceived exertion (RPE) scale  Yes       Intervention  Provide education and explanation on how to use RPE scale       Expected Outcomes  Short Term: Able to use RPE daily in  rehab to express subjective intensity level;Long Term:  Able to use RPE to guide intensity level when exercising independently       Knowledge and understanding of Target Heart Rate Range (THRR)  Yes       Intervention  Provide education and explanation of THRR including how the numbers were predicted and where they are located for reference       Expected Outcomes  Short Term: Able to state/look up THRR;Long Term: Able to use THRR to govern intensity when exercising independently;Short Term: Able to use daily as guideline for intensity in rehab       Able to check pulse independently  Yes       Intervention  Provide education and demonstration on how to check pulse in carotid and radial arteries.;Review the importance of being able to check your own pulse for safety during independent exercise       Expected Outcomes  Short Term: Able to explain why pulse checking is important during independent exercise;Long Term: Able to check pulse independently and accurately       Understanding of Exercise Prescription  Yes       Intervention  Provide education, explanation, and written materials on patient's individual exercise prescription       Expected Outcomes  Short Term: Able to explain program exercise prescription;Long Term: Able to explain home exercise prescription to exercise independently          Copy of goals given to participant.

## 2017-02-25 ENCOUNTER — Encounter: Payer: Self-pay | Admitting: *Deleted

## 2017-02-25 DIAGNOSIS — Z955 Presence of coronary angioplasty implant and graft: Secondary | ICD-10-CM

## 2017-02-25 DIAGNOSIS — I213 ST elevation (STEMI) myocardial infarction of unspecified site: Secondary | ICD-10-CM

## 2017-02-25 NOTE — Progress Notes (Signed)
Cardiac Individual Treatment Plan  Patient Details  Name: Jenna Boyer MRN: [id number] Date of Birth: 1965/12/02 Referring Provider:     Cardiac Rehab from 02/23/2017 in [facility] Cardiac and Pulmonary Rehab  Referring Provider  Neoma Laming MD      Initial Encounter Date:    Cardiac Rehab from 02/23/2017 in [facility] Cardiac and Pulmonary Rehab  Date  02/23/17  Referring Provider  Neoma Laming MD      Visit Diagnosis: ST elevation myocardial infarction (STEMI), unspecified artery [facility])  Status post coronary artery stent placement  Patient's Home Medications on Admission:  Current Outpatient Medications:  .  aspirin EC 81 MG tablet, Take 81 mg by mouth daily., Disp: , Rfl:  .  digoxin (LANOXIN) 0.25 MG tablet, Take 1 tablet (0.25 mg total) by mouth daily., Disp: 30 tablet, Rfl: 0 .  fexofenadine (ALLEGRA) 180 MG tablet, Take 180 mg by mouth daily., Disp: , Rfl:  .  metoprolol succinate (TOPROL-XL) 50 MG 24 hr tablet, TAKE 1 TABLET BY MOUTH EVERY DAY--NEW DIRECTIONS, Disp: , Rfl: 1 .  metoprolol tartrate (LOPRESSOR) 25 MG tablet, Take 0.5 tablets (12.5 mg total) by mouth 2 (two) times daily. Hold if sbp<100 or heart rate less than 50 (Patient not taking: Reported on 02/23/2017), Disp: 30 tablet, Rfl: 11 .  metoprolol tartrate (LOPRESSOR) 25 MG tablet, Take 0.5 tablets (12.5 mg total) by mouth 2 (two) times daily. (Patient not taking: Reported on 02/23/2017), Disp: 30 tablet, Rfl: 0 .  midodrine (PROAMATINE) 10 MG tablet, Take 1 tablet (10 mg total) by mouth 3 (three) times daily with meals. (Patient not taking: Reported on 02/23/2017), Disp: 90 tablet, Rfl: 0 .  midodrine (PROAMATINE) 5 MG tablet, TAKE 1 TABLET BY MOUTH ONCE DAILY. NOTE DOSE DECREASE., Disp: , Rfl: 1 .  nitroGLYCERIN (NITROSTAT) 0.4 MG SL tablet, Place 1 tablet (0.4 mg total) under the tongue every 5 (five) minutes as needed for chest pain., Disp: 10 tablet, Rfl: 12 .  rosuvastatin (CRESTOR) 40 MG tablet, Take 1 tablet (40 mg  total) by mouth every evening., Disp: 30 tablet, Rfl: 0 .  ticagrelor (BRILINTA) 90 MG TABS tablet, Take 1 tablet (90 mg total) by mouth 2 (two) times daily., Disp: 60 tablet, Rfl: 2  Past Medical History: Past Medical History:  Diagnosis Date  . Hyperlipemia   . Hypertension     Tobacco Use: Social History   Tobacco Use  Smoking Status Never Smoker  Smokeless Tobacco Never Used    Labs: Recent Review Flowsheet Data    Labs for ITP Cardiac and Pulmonary Rehab Latest Ref Rng & Units 01/25/2017 01/27/2017   Cholestrol 0 - 200 mg/dL - 151   LDLCALC 0 - 99 mg/dL - 83   HDL >40 mg/dL - 50   Trlycerides <150 mg/dL - 89   Hemoglobin A1c 4.8 - 5.6 % 6.2(H) -       Exercise Target Goals:    Exercise Program Goal: Individual exercise prescription set using results from initial 6 min walk test and THRR while considering  patient's activity barriers and safety.   Exercise Prescription Goal: Initial exercise prescription builds to 30-45 minutes a day of aerobic activity, 2-3 days per week.  Home exercise guidelines will be given to patient during program as part of exercise prescription that the participant will acknowledge.  Activity Barriers & Risk Stratification: Activity Barriers & Cardiac Risk Stratification - 02/23/17 1316      Activity Barriers & Cardiac Risk Stratification   Activity Barriers  None    Cardiac Risk Stratification  Moderate       6 Minute Walk: 6 Minute Walk    Row Name 02/23/17 1442         6 Minute Walk   Phase  Initial     Distance  1230 feet     Walk Time  6 minutes     # of Rest Breaks  0     MPH  2.33     METS  4.01     RPE  13     VO2 Peak  14.03     Symptoms  No     Resting HR  64 bpm     Resting BP  126/74     Resting Oxygen Saturation   99 %     Exercise Oxygen Saturation  during 6 min walk  100 %     Max Ex. HR  97 bpm     Max Ex. BP  142/64     2 Minute Post BP  132/70        Oxygen Initial Assessment:   Oxygen  Re-Evaluation:   Oxygen Discharge (Final Oxygen Re-Evaluation):   Initial Exercise Prescription: Initial Exercise Prescription - 02/23/17 1400      Date of Initial Exercise RX and Referring Provider   Date  02/23/17    Referring Provider  Neoma Laming MD      Treadmill   MPH  2.3    Grade  3    Minutes  15    METs  3.71      NuStep   Level  4    SPM  80    Minutes  15    METs  3.7      Elliptical   Level  1    Speed  3.9    Minutes  15      Prescription Details   Frequency (times per week)  3    Duration  Progress to 45 minutes of aerobic exercise without signs/symptoms of physical distress      Intensity   THRR 40-80% of Max Heartrate  107-150    Ratings of Perceived Exertion  11-13    Perceived Dyspnea  0-4      Progression   Progression  Continue to progress workloads to maintain intensity without signs/symptoms of physical distress.      Resistance Training   Training Prescription  Yes    Weight  4 lbs    Reps  10-15       Perform Capillary Blood Glucose checks as needed.  Exercise Prescription Changes: Exercise Prescription Changes    Row Name 02/23/17 1400             Response to Exercise   Blood Pressure (Admit)  126/74       Blood Pressure (Exercise)  142/64       Blood Pressure (Exit)  132/70       Heart Rate (Admit)  64 bpm       Heart Rate (Exercise)  97 bpm       Heart Rate (Exit)  66 bpm       Oxygen Saturation (Admit)  99 %       Oxygen Saturation (Exercise)  100 %       Rating of Perceived Exertion (Exercise)  13       Symptoms  none       Comments  walk test results  Exercise Comments:   Exercise Goals and Review: Exercise Goals    Row Name 02/23/17 1449             Exercise Goals   Increase Physical Activity  Yes       Intervention  Provide advice, education, support and counseling about physical activity/exercise needs.;Develop an individualized exercise prescription for aerobic and resistive training  based on initial evaluation findings, risk stratification, comorbidities and participant's personal goals.       Expected Outcomes  Short Term: Attend rehab on a regular basis to increase amount of physical activity.;Long Term: Add in home exercise to make exercise part of routine and to increase amount of physical activity.;Long Term: Exercising regularly at least 3-5 days a week.       Increase Strength and Stamina  Yes       Intervention  Provide advice, education, support and counseling about physical activity/exercise needs.;Develop an individualized exercise prescription for aerobic and resistive training based on initial evaluation findings, risk stratification, comorbidities and participant's personal goals.       Expected Outcomes  Short Term: Increase workloads from initial exercise prescription for resistance, speed, and METs.;Short Term: Perform resistance training exercises routinely during rehab and add in resistance training at home;Long Term: Improve cardiorespiratory fitness, muscular endurance and strength as measured by increased METs and functional capacity (6MWT)       Able to understand and use rate of perceived exertion (RPE) scale  Yes       Intervention  Provide education and explanation on how to use RPE scale       Expected Outcomes  Short Term: Able to use RPE daily in rehab to express subjective intensity level;Long Term:  Able to use RPE to guide intensity level when exercising independently       Knowledge and understanding of Target Heart Rate Range (THRR)  Yes       Intervention  Provide education and explanation of THRR including how the numbers were predicted and where they are located for reference       Expected Outcomes  Short Term: Able to state/look up THRR;Long Term: Able to use THRR to govern intensity when exercising independently;Short Term: Able to use daily as guideline for intensity in rehab       Able to check pulse independently  Yes       Intervention   Provide education and demonstration on how to check pulse in carotid and radial arteries.;Review the importance of being able to check your own pulse for safety during independent exercise       Expected Outcomes  Short Term: Able to explain why pulse checking is important during independent exercise;Long Term: Able to check pulse independently and accurately       Understanding of Exercise Prescription  Yes       Intervention  Provide education, explanation, and written materials on patient's individual exercise prescription       Expected Outcomes  Short Term: Able to explain program exercise prescription;Long Term: Able to explain home exercise prescription to exercise independently          Exercise Goals Re-Evaluation :   Discharge Exercise Prescription (Final Exercise Prescription Changes): Exercise Prescription Changes - 02/23/17 1400      Response to Exercise   Blood Pressure (Admit)  126/74    Blood Pressure (Exercise)  142/64    Blood Pressure (Exit)  132/70    Heart Rate (Admit)  64 bpm    Heart Rate (  Exercise)  97 bpm    Heart Rate (Exit)  66 bpm    Oxygen Saturation (Admit)  99 %    Oxygen Saturation (Exercise)  100 %    Rating of Perceived Exertion (Exercise)  13    Symptoms  none    Comments  walk test results       Nutrition:  Target Goals: Understanding of nutrition guidelines, daily intake of sodium '1500mg'$ , cholesterol '200mg'$ , calories 30% from fat and 7% or less from saturated fats, daily to have 5 or more servings of fruits and vegetables.  Biometrics: Pre Biometrics - 02/23/17 1449      Pre Biometrics   Height  5' 2.1" (1.577 m)    Weight  136 lb 1.6 oz (61.7 kg)    Waist Circumference  30 inches    Hip Circumference  37 inches    Waist to Hip Ratio  0.81 %    BMI (Calculated)  24.82    Single Leg Stand  24.57 seconds        Nutrition Therapy Plan and Nutrition Goals: Nutrition Therapy & Goals - 02/23/17 1311      Intervention Plan    Intervention  Prescribe, educate and counsel regarding individualized specific dietary modifications aiming towards targeted core components such as weight, hypertension, lipid management, diabetes, heart failure and other comorbidities.;Nutrition handout(s) given to patient.    Expected Outcomes  Short Term Goal: Understand basic principles of dietary content, such as calories, fat, sodium, cholesterol and nutrients.;Short Term Goal: A plan has been developed with personal nutrition goals set during dietitian appointment.;Long Term Goal: Adherence to prescribed nutrition plan.       Nutrition Assessments: Nutrition Assessments - 02/23/17 1052      MEDFICTS Scores   Pre Score  60       Nutrition Goals Re-Evaluation:   Nutrition Goals Discharge (Final Nutrition Goals Re-Evaluation):   Psychosocial: Target Goals: Acknowledge presence or absence of significant depression and/or stress, maximize coping skills, provide positive support system. Participant is able to verbalize types and ability to use techniques and skills needed for reducing stress and depression.   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 She has been caring for her mom for 7 years and started right after her own husband passed away. Her mother's health is steadily declining and it is hard to focus on her own health after this STEMI.        Family Dynamics   Good Support System?  -- friends and pastor      Screening Interventions   Interventions  Encouraged to exercise;Program counselor consult    Expected Outcomes  Short Term goal: Utilizing psychosocial counselor, staff and physician to assist with identification of specific Stressors or current issues interfering with healing  process. Setting desired goal for each stressor or current issue identified.;Long Term Goal: Stressors or current issues are controlled or eliminated.;Short Term goal: Identification and review with participant of any Quality of Life or Depression concerns found by scoring the questionnaire.;Long Term goal: The participant improves quality of Life and PHQ9 Scores as seen by post scores and/or verbalization of changes       Quality of Life Scores:  Quality of Life - 02/23/17  1048      Quality of Life Scores   Health/Function Pre  20.4 %    Socioeconomic Pre  20.63 %    Psych/Spiritual Pre  24.64 %    Family Pre  8.8 %    GLOBAL Pre  19.64 %      Scores of 19 and below usually indicate a poorer quality of life in these areas.  A difference of  2-3 points is a clinically meaningful difference.  A difference of 2-3 points in the total score of the Quality of Life Index has been associated with significant improvement in overall quality of life, self-image, physical symptoms, and general health in studies assessing change in quality of life.  PHQ-9: Recent Review Flowsheet Data    Depression screen [facility] 2/9 02/23/2017   Decreased Interest 0   Down, Depressed, Hopeless 0   PHQ - 2 Score 0   Altered sleeping 0   Tired, decreased energy 1   Change in appetite 0   Feeling bad or failure about yourself  0   Trouble concentrating 0   Moving slowly or fidgety/restless 0   Suicidal thoughts 0   PHQ-9 Score 1   Difficult doing work/chores Not difficult at all     Interpretation of Total Score  Total Score Depression Severity:  1-4 = Minimal depression, 5-9 = Mild depression, 10-14 = Moderate depression, 15-19 = Moderately severe depression, 20-27 = Severe depression   Psychosocial Evaluation and Intervention:   Psychosocial Re-Evaluation:   Psychosocial Discharge (Final Psychosocial Re-Evaluation):   Vocational Rehabilitation: Provide vocational rehab assistance to qualifying  candidates.   Vocational Rehab Evaluation & Intervention: Vocational Rehab - 02/23/17 1315      Initial Vocational Rehab Evaluation & Intervention   Assessment shows need for Vocational Rehabilitation  No       Education: Education Goals: Education classes will be provided on a variety of topics geared toward better understanding of heart health and risk factor modification. Participant will state understanding/return demonstration of topics presented as noted by education test scores.  Learning Barriers/Preferences: Learning Barriers/Preferences - 02/23/17 1314      Learning Barriers/Preferences   Learning Barriers  None    Learning Preferences  None       Education Topics:  AED/CPR: - Group verbal and written instruction with the use of models to demonstrate the basic use of the AED with the basic ABC's of resuscitation.   General Nutrition Guidelines/Fats and Fiber: -Group instruction provided by verbal, written material, models and posters to present the general guidelines for heart healthy nutrition. Gives an explanation and review of dietary fats and fiber.   Controlling Sodium/Reading Food Labels: -Group verbal and written material supporting the discussion of sodium use in heart healthy nutrition. Review and explanation with models, verbal and written materials for utilization of the food label.   Exercise Physiology & General Exercise Guidelines: - Group verbal and written instruction with models to review the exercise physiology of the cardiovascular system and associated critical values. Provides general exercise guidelines with specific guidelines to those with heart or lung disease.    Aerobic Exercise & Resistance Training: - Gives group verbal and written instruction on the various components of exercise. Focuses on aerobic and resistive training programs and the benefits of this training and how to safely progress through these programs..   Flexibility,  Balance, Mind/Body Relaxation: Provides group verbal/written instruction on the benefits of flexibility and balance training, including mind/body exercise modes such as yoga, pilates  and tai chi.  Demonstration and skill practice provided.   Stress and Anxiety: - Provides group verbal and written instruction about the health risks of elevated stress and causes of high stress.  Discuss the correlation between heart/lung disease and anxiety and treatment options. Review healthy ways to manage with stress and anxiety.   Depression: - Provides group verbal and written instruction on the correlation between heart/lung disease and depressed mood, treatment options, and the stigmas associated with seeking treatment.   Anatomy & Physiology of the Heart: - Group verbal and written instruction and models provide basic cardiac anatomy and physiology, with the coronary electrical and arterial systems. Review of Valvular disease and Heart Failure   Cardiac Procedures: - Group verbal and written instruction to review commonly prescribed medications for heart disease. Reviews the medication, class of the drug, and side effects. Includes the steps to properly store meds and maintain the prescription regimen. (beta blockers and nitrates)   Cardiac Medications I: - Group verbal and written instruction to review commonly prescribed medications for heart disease. Reviews the medication, class of the drug, and side effects. Includes the steps to properly store meds and maintain the prescription regimen.   Cardiac Medications II: -Group verbal and written instruction to review commonly prescribed medications for heart disease. Reviews the medication, class of the drug, and side effects. (all other drug classes)    Go Sex-Intimacy & Heart Disease, Get SMART - Goal Setting: - Group verbal and written instruction through game format to discuss heart disease and the return to sexual intimacy. Provides group  verbal and written material to discuss and apply goal setting through the application of the S.M.A.R.T. Method.   Other Matters of the Heart: - Provides group verbal, written materials and models to describe Stable Angina and Peripheral Artery. Includes description of the disease process and treatment options available to the cardiac patient.   Exercise & Equipment Safety: - Individual verbal instruction and demonstration of equipment use and safety with use of the equipment.   Cardiac Rehab from 02/23/2017 in [facility] Cardiac and Pulmonary Rehab  Date  02/23/17  Educator  [facility]  Instruction Review Code  1- Verbalizes Understanding      Infection Prevention: - Provides verbal and written material to individual with discussion of infection control including proper hand washing and proper equipment cleaning during exercise session.   Cardiac Rehab from 02/23/2017 in [facility] Cardiac and Pulmonary Rehab  Date  02/23/17  Educator  [facility]  Instruction Review Code  1- Verbalizes Understanding      Falls Prevention: - Provides verbal and written material to individual with discussion of falls prevention and safety.   Cardiac Rehab from 02/23/2017 in [facility] Cardiac and Pulmonary Rehab  Date  02/23/17  Educator  [facility]  Instruction Review Code  1- Verbalizes Understanding      Diabetes: - Individual verbal and written instruction to review signs/symptoms of diabetes, desired ranges of glucose level fasting, after meals and with exercise. Acknowledge that pre and post exercise glucose checks will be done for 3 sessions at entry of program.   Know Your Numbers and Risk Factors: -Group verbal and written instruction about important numbers in your health.  Discussion of what are risk factors and how they play a role in the disease process.  Review of Cholesterol, Blood Pressure, Diabetes, and BMI and the role they play in your overall health.   Sleep Hygiene: -Provides group verbal and written instruction  about how sleep can affect your health.  Define sleep hygiene, discuss sleep cycles and impact of sleep habits. Review good sleep hygiene tips.    Other: -Provides group and verbal instruction on various topics (see comments)   Knowledge Questionnaire Score: Knowledge Questionnaire Score - 02/23/17 1051      Knowledge Questionnaire Score   Pre Score  19/28 correct answers reviewed with Zakyia       Core Components/Risk Factors/Patient Goals at Admission: Personal Goals and Risk Factors at Admission - 02/23/17 1304      Core Components/Risk Factors/Patient Goals on Admission    Weight Management  Weight Maintenance;Yes    Intervention  Weight Management: Develop a combined nutrition and exercise program designed to reach desired caloric intake, while maintaining appropriate intake of nutrient and fiber, sodium and fats, and appropriate energy expenditure required for the weight goal.;Weight Management: Provide education and appropriate resources to help participant work on and attain dietary goals.    Admit Weight  136 lb (61.7 kg) She has already lost 10 lb without really trying. She wants to maintain around 135 lb    Goal Weight: Short Term  136 lb (61.7 kg)    Goal Weight: Long Term  136 lb (61.7 kg)    Expected Outcomes  Short Term: Continue to assess and modify interventions until short term weight is achieved;Long Term: Adherence to nutrition and physical activity/exercise program aimed toward attainment of established weight goal;Weight Maintenance: Understanding of the daily nutrition guidelines, which includes 25-35% calories from fat, 7% or less cal from saturated fats, less than 242m cholesterol, less than 1.5gm of sodium, & 5 or more servings of fruits and vegetables daily;Understanding recommendations for meals to include 15-35% energy as protein, 25-35% energy from fat, 35-60% energy from carbohydrates, less than 2092mof dietary cholesterol, 20-35 gm of total fiber  daily;Understanding of distribution of calorie intake throughout the day with the consumption of 4-5 meals/snacks    Hypertension  Yes    Intervention  Provide education on lifestyle modifcations including regular physical activity/exercise, weight management, moderate sodium restriction and increased consumption of fresh fruit, vegetables, and low fat dairy, alcohol moderation, and smoking cessation.;Monitor prescription use compliance.    Expected Outcomes  Short Term: Continued assessment and intervention until BP is < 140/9070mG in hypertensive participants. < 130/55m14m in hypertensive participants with diabetes, heart failure or chronic kidney disease.;Long Term: Maintenance of blood pressure at goal levels.    Lipids  Yes    Intervention  Provide education and support for participant on nutrition & aerobic/resistive exercise along with prescribed medications to achieve LDL <70mg56mL >40mg.16mExpected Outcomes  Short Term: Participant states understanding of desired cholesterol values and is compliant with medications prescribed. Participant is following exercise prescription and nutrition guidelines.;Long Term: Cholesterol controlled with medications as prescribed, with individualized exercise RX and with personalized nutrition plan. Value goals: LDL < 70mg, 62m> 40 mg.    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 She has been caring for her mom for the past 7 years, she started right after her husband passed away. Her mother's health is steadily declining and it is hard to focus on herself.     Intervention  Offer individual and/or small group education and counseling on adjustment to heart disease, stress management and health-related lifestyle change. Teach and support self-help strategies.;Refer participants experiencing significant psychosocial distress to appropriate mental health specialists for further evaluation and treatment. When possible,  include family members  and significant others in education/counseling sessions.    Expected Outcomes  Short Term: Participant demonstrates changes in health-related behavior, relaxation and other stress management skills, ability to obtain effective social support, and compliance with psychotropic medications if prescribed.;Long Term: Emotional wellbeing is indicated by absence of clinically significant psychosocial distress or social isolation.       Core Components/Risk Factors/Patient Goals Review:    Core Components/Risk Factors/Patient Goals at Discharge (Final Review):    ITP Comments: ITP Comments    Row Name 02/23/17 1254 02/25/17 0648         ITP Comments  Med Review completed. Initial ITP created. Diagnosis can be found in [facility] encounter 01/25/17  30 Day review. Continue with ITP unless directed changes per Medical Director review.  New to program         Comments:

## 2017-02-27 NOTE — Progress Notes (Signed)
Advise rehab

## 2017-03-02 ENCOUNTER — Encounter: Payer: BLUE CROSS/BLUE SHIELD | Attending: Cardiovascular Disease

## 2017-03-02 DIAGNOSIS — Z955 Presence of coronary angioplasty implant and graft: Secondary | ICD-10-CM | POA: Diagnosis not present

## 2017-03-02 DIAGNOSIS — Z7982 Long term (current) use of aspirin: Secondary | ICD-10-CM | POA: Diagnosis not present

## 2017-03-02 DIAGNOSIS — Z7902 Long term (current) use of antithrombotics/antiplatelets: Secondary | ICD-10-CM | POA: Diagnosis not present

## 2017-03-02 DIAGNOSIS — Z79899 Other long term (current) drug therapy: Secondary | ICD-10-CM | POA: Insufficient documentation

## 2017-03-02 DIAGNOSIS — E785 Hyperlipidemia, unspecified: Secondary | ICD-10-CM | POA: Diagnosis not present

## 2017-03-02 DIAGNOSIS — I252 Old myocardial infarction: Secondary | ICD-10-CM | POA: Diagnosis present

## 2017-03-02 DIAGNOSIS — I213 ST elevation (STEMI) myocardial infarction of unspecified site: Secondary | ICD-10-CM

## 2017-03-02 DIAGNOSIS — I10 Essential (primary) hypertension: Secondary | ICD-10-CM | POA: Diagnosis not present

## 2017-03-02 NOTE — Progress Notes (Signed)
Daily Session Note  Patient Details  Name: Jenna Boyer MRN: [id number] Date of Birth: 1965-05-16 Referring Provider:     Cardiac Rehab from 02/23/2017 in [facility] Cardiac [facility] Pulmonary Rehab  Referring Provider  Neoma Laming MD      Encounter Date: 03/02/2017  Check In: Session Check In - 03/02/17 1628      Check-In   Location  [facility]-Cardiac & Pulmonary Rehab    Staff Present  Nada Maclachlan, BA, ACSM CEP, Exercise Physiologist;Carroll Enterkin, RN, Moises Blood, BS, ACSM CEP, Exercise Physiologist    Supervising physician immediately available to respond to emergencies  See telemetry face sheet for immediately available ER MD    Medication changes reported      No    Fall or balance concerns reported     No    Warm-up and Cool-down  Performed on first and last piece of equipment    Resistance Training Performed  Yes    VAD Patient?  No      Pain Assessment   Currently in Pain?  No/denies    Multiple Pain Sites  No          Social History   Tobacco Use  Smoking Status Never Smoker  Smokeless Tobacco Never Used    Goals Met:  Proper associated with RPD/PD & O2 Sat Independence with exercise equipment Exercise tolerated well Strength training completed today  Goals Unmet:  Not Applicable  Comments: First full day of exercise!  Patient was oriented to gym and equipment including functions, settings, policies, and procedures.  Patient's individual exercise prescription and treatment plan were reviewed.  All starting workloads were established based on the results of the 6 minute walk test done at initial orientation visit.  The plan for exercise progression was also introduced and progression will be customized based on patient's performance and goals.    Dr. Emily Filbert is Medical Director for [place] and [facility].

## 2017-03-04 DIAGNOSIS — Z955 Presence of coronary angioplasty implant and graft: Secondary | ICD-10-CM

## 2017-03-04 DIAGNOSIS — I213 ST elevation (STEMI) myocardial infarction of unspecified site: Secondary | ICD-10-CM

## 2017-03-04 NOTE — Progress Notes (Signed)
Daily Session Note  Patient Details  Name: Jenna Boyer MRN: [id number] Date of Birth: 1965-12-17 Referring Provider:     Cardiac Rehab from 02/23/2017 in [facility] Cardiac and Pulmonary Rehab  Referring Provider  Neoma Laming MD      Encounter Date: 03/04/2017  Check In: Session Check In - 03/04/17 1644      Check-In   Location  [facility]    Staff Present  Renita Papa, RN Vickki Hearing, BA, ACSM CEP, Exercise Physiologist;Carroll Enterkin, RN, BSN    Supervising physician immediately available to respond to emergencies  See telemetry face sheet for immediately available ER MD    Medication changes reported      No    Fall or balance concerns reported     No    Warm-up and Cool-down  Performed on first and last piece of equipment    Resistance Training Performed  Yes    VAD Patient?  No      Pain Assessment   Currently in Pain?  No/denies    Multiple Pain Sites  No        Exercise Prescription Changes - 03/04/17 1200      Response to Exercise   Blood Pressure (Admit)  112/80    Blood Pressure (Exercise)  124/62    Blood Pressure (Exit)  104/52    Heart Rate (Admit)  72 bpm    Heart Rate (Exercise)  130 bpm    Heart Rate (Exit)  83 bpm    Rating of Perceived Exertion (Exercise)  13    Symptoms  none      Progression   Progression  Continue to progress workloads to maintain intensity without signs/symptoms of physical distress.    Average METs  2.85      Resistance Training   Training Prescription  Yes    Weight  4 lb    Reps  10-15      Treadmill   MPH  2.3    Grade  3    Minutes  15    METs  3.71      NuStep   Level  2    SPM  80    Minutes  15    METs  2       Social History   Tobacco Use  Smoking Status Never Smoker  Smokeless Tobacco Never Used    Goals Met:  Independence with exercise equipment Exercise tolerated well  Goals Unmet:  Not Applicable  Comments: Pt able to follow exercise prescription today  without complaint.  Will continue to monitor for progression.    Dr. Emily Filbert is Medical Director for [place] and [facility].

## 2017-03-05 DIAGNOSIS — I213 ST elevation (STEMI) myocardial infarction of unspecified site: Secondary | ICD-10-CM

## 2017-03-05 DIAGNOSIS — I252 Old myocardial infarction: Secondary | ICD-10-CM | POA: Diagnosis not present

## 2017-03-05 NOTE — Progress Notes (Signed)
Daily Session Note  Patient Details  Name: Jenna Boyer MRN: [id number] Date of Birth: October 17, 1965 Referring Provider:     Cardiac Rehab from 02/23/2017 in [facility] Cardiac and Pulmonary Rehab  Referring Provider  Neoma Laming MD      Encounter Date: 03/05/2017  Check In: Session Check In - 03/05/17 1646      Check-In   Location  [facility]    Staff Present  Earlean Shawl, BS, ACSM CEP, Exercise Physiologist;Meredith Sherryll Burger, RN BSN;Purva Vessell Flavia Shipper    Supervising physician immediately available to respond to emergencies  See telemetry face sheet for immediately available ER MD    Medication changes reported      No    Fall or balance concerns reported     No    Warm-up and Cool-down  Performed on first and last piece of equipment    Resistance Training Performed  Yes    VAD Patient?  No      Pain Assessment   Currently in Pain?  No/denies          Social History   Tobacco Use  Smoking Status Never Smoker  Smokeless Tobacco Never Used    Goals Met:  Independence with exercise equipment Exercise tolerated well No report of cardiac concerns or symptoms Strength training completed today  Goals Unmet:  Not Applicable  Comments: Pt able to follow exercise prescription today without complaint.  Will continue to monitor for progression.   Dr. Emily Filbert is Medical Director for [facility] and [facility].

## 2017-03-09 ENCOUNTER — Encounter: Payer: BLUE CROSS/BLUE SHIELD | Admitting: *Deleted

## 2017-03-09 DIAGNOSIS — Z955 Presence of coronary angioplasty implant and graft: Secondary | ICD-10-CM

## 2017-03-09 DIAGNOSIS — I252 Old myocardial infarction: Secondary | ICD-10-CM | POA: Diagnosis not present

## 2017-03-09 DIAGNOSIS — I213 ST elevation (STEMI) myocardial infarction of unspecified site: Secondary | ICD-10-CM

## 2017-03-09 NOTE — Progress Notes (Signed)
Daily Session Note  Patient Details  Name: Jenna Boyer MRN: [id number] Date of Birth: March 07, 1965 Referring Provider:     Cardiac Rehab from 02/23/2017 in [facility] Cardiac and Pulmonary Rehab  Referring Provider  Neoma Laming MD      Encounter Date: 03/09/2017  Check In: Session Check In - 03/09/17 1729      Check-In   Location  [facility]    Staff Present  Nyoka Cowden, RN, BSN, [place], BS, ACSM CEP, Exercise Physiologist;Amanda Oletta Darter, [place], ACSM CEP, Exercise Physiologist;Joseph Flavia Shipper    Supervising physician immediately available to respond to emergencies  See telemetry face sheet for immediately available ER MD    Medication changes reported      No    Fall or balance concerns reported     No    Warm-up and Cool-down  Performed on first and last piece of equipment    Resistance Training Performed  Yes    VAD Patient?  No      Pain Assessment   Currently in Pain?  No/denies    Multiple Pain Sites  No          Social History   Tobacco Use  Smoking Status Never Smoker  Smokeless Tobacco Never Used    Goals Met:  Independence with exercise equipment Exercise tolerated well No report of cardiac concerns or symptoms Strength training completed today  Goals Unmet:  Not Applicable  Comments: Pt able to follow exercise prescription today without complaint.  Will continue to monitor for progression.    Dr. Emily Filbert is Medical Director for [place] and [facility].

## 2017-03-11 ENCOUNTER — Encounter: Payer: BLUE CROSS/BLUE SHIELD | Admitting: *Deleted

## 2017-03-11 DIAGNOSIS — I213 ST elevation (STEMI) myocardial infarction of unspecified site: Secondary | ICD-10-CM

## 2017-03-11 DIAGNOSIS — Z955 Presence of coronary angioplasty implant and graft: Secondary | ICD-10-CM

## 2017-03-11 DIAGNOSIS — I252 Old myocardial infarction: Secondary | ICD-10-CM | POA: Diagnosis not present

## 2017-03-11 NOTE — Progress Notes (Signed)
Daily Session Note  Patient Details  Name: Jenna Boyer MRN: [id number] Date of Birth: [birth year] Referring Provider:     Cardiac Rehab from 02/23/2017 in [facility] Cardiac and Pulmonary Rehab  Referring Provider  Neoma Laming MD      Encounter Date: 03/11/2017  Check In: Session Check In - 03/11/17 1655      Check-In   Staff Present  Renita Papa, RN Vickki Hearing, BA, ACSM CEP, Exercise Physiologist;Carroll Enterkin, RN, BSN    Supervising physician immediately available to respond to emergencies  See telemetry face sheet for immediately available ER MD    Medication changes reported      No    Fall or balance concerns reported     No    Warm-up and Cool-down  Performed on first and last piece of equipment    Resistance Training Performed  Yes    VAD Patient?  No      Pain Assessment   Currently in Pain?  No/denies          Social History   Tobacco Use  Smoking Status Never Smoker  Smokeless Tobacco Never Used    Goals Met:  Independence with exercise equipment Exercise tolerated well No report of cardiac concerns or symptoms Strength training completed today  Goals Unmet:  Not Applicable  Comments: Pt able to follow exercise prescription today without complaint.  Will continue to monitor for progression.    Dr. Emily Filbert is Medical Director for [place] and [facility].

## 2017-03-12 DIAGNOSIS — I213 ST elevation (STEMI) myocardial infarction of unspecified site: Secondary | ICD-10-CM

## 2017-03-12 DIAGNOSIS — I252 Old myocardial infarction: Secondary | ICD-10-CM | POA: Diagnosis not present

## 2017-03-12 NOTE — Progress Notes (Signed)
Daily Session Note  Patient Details  Name: Jenna Boyer MRN: [id number] Date of Birth: [birth year] Referring Provider:     Cardiac Rehab from 02/23/2017 in [facility] Cardiac and Pulmonary Rehab  Referring Provider  Neoma Laming MD      Encounter Date: 03/12/2017  Check In: Session Check In - 03/12/17 1703      Check-In   Location  [facility]    Staff Present  Earlean Shawl, BS, ACSM CEP, Exercise Physiologist;Meredith Sherryll Burger, RN BSN;Carlyon Nolasco Flavia Shipper    Supervising physician immediately available to respond to emergencies  See telemetry face sheet for immediately available ER MD    Medication changes reported      No    Fall or balance concerns reported     No    Warm-up and Cool-down  Performed on first and last piece of equipment    Resistance Training Performed  Yes    VAD Patient?  No      Pain Assessment   Currently in Pain?  No/denies          Social History   Tobacco Use  Smoking Status Never Smoker  Smokeless Tobacco Never Used    Goals Met:  Independence with exercise equipment Exercise tolerated well No report of cardiac concerns or symptoms Strength training completed today  Goals Unmet:  Not Applicable  Comments: Pt able to follow exercise prescription today without complaint.  Will continue to monitor for progression.   Dr. Emily Filbert is Medical Director for [place] and [facility].

## 2017-03-16 DIAGNOSIS — I252 Old myocardial infarction: Secondary | ICD-10-CM | POA: Diagnosis not present

## 2017-03-16 DIAGNOSIS — I213 ST elevation (STEMI) myocardial infarction of unspecified site: Secondary | ICD-10-CM

## 2017-03-16 DIAGNOSIS — Z955 Presence of coronary angioplasty implant and graft: Secondary | ICD-10-CM

## 2017-03-16 NOTE — Progress Notes (Signed)
Daily Session Note  Patient Details  Name: Jenna Boyer MRN: [id number] Date of Birth: 08/22/1965 Referring Provider:     Cardiac Rehab from 02/23/2017 in [facility] Cardiac and Pulmonary Rehab  Referring Provider  Khan, Shaukat MD      Encounter Date: 03/16/2017  Check In: Session Check In - 03/16/17 1758      Check-In   Location  [facility] Re[facility]    Staff Present  Kelly Hayes, BS, ACSM CEP, Exercise Physiologist;Amanda Sommer, BA, ACSM CEP, Exercise Physiologist;Carroll Enterkin, RN, BSN    Supervising physician immediately available to respond to emergencies  See telemetry face sheet for immediately available ER MD    Medication changes reported      No    Fall or balance concerns reported     No    Warm-up and Cool-down  Performed on first and last piece of equipment    Resistance Training Performed  Yes    VAD Patient?  No      Pain Assessment   Currently in Pain?  No/denies    Multiple Pain Sites  No          Social History   Tobacco Use  Smoking Status Never Smoker  Smokeless Tobacco Never Used    Goals Met:  Independence with exercise equipment Exercise tolerated well No report of cardiac concerns or symptoms Strength training completed today  Goals Unmet:  Not Applicable  Comments: Pt able to follow exercise prescription today without complaint.  Will continue to monitor for progression.    Dr. Mark Miller is Medical Director for [facility] and [facility]. 

## 2017-03-18 ENCOUNTER — Encounter: Payer: BLUE CROSS/BLUE SHIELD | Admitting: *Deleted

## 2017-03-18 DIAGNOSIS — I252 Old myocardial infarction: Secondary | ICD-10-CM | POA: Diagnosis not present

## 2017-03-18 DIAGNOSIS — I213 ST elevation (STEMI) myocardial infarction of unspecified site: Secondary | ICD-10-CM

## 2017-03-18 DIAGNOSIS — Z955 Presence of coronary angioplasty implant and graft: Secondary | ICD-10-CM

## 2017-03-18 NOTE — Progress Notes (Signed)
Daily Session Note  Patient Details  Name: Jenna Boyer MRN: [id number] Date of Birth: [birth year] Referring Provider:     Cardiac Rehab from 02/23/2017 in [facility] Cardiac and Pulmonary Rehab  Referring Provider  Neoma Laming MD      Encounter Date: 03/18/2017  Check In: Session Check In - 03/18/17 1641      Check-In   Location  [facility]    Staff Present  Renita Papa, RN Vickki Hearing, BA, ACSM CEP, Exercise Physiologist;Carroll Guy Begin, RN, BSN    Supervising physician immediately available to respond to emergencies  See telemetry face sheet for immediately available ER MD    Medication changes reported      No    Fall or balance concerns reported     No    Warm-up and Cool-down  Performed on first and last piece of equipment    Resistance Training Performed  Yes    VAD Patient?  No      Pain Assessment   Currently in Pain?  No/denies          Social History   Tobacco Use  Smoking Status Never Smoker  Smokeless Tobacco Never Used    Goals Met:  Independence with exercise equipment Exercise tolerated well No report of cardiac concerns or symptoms Strength training completed today  Goals Unmet:  Not Applicable  Comments: Pt able to follow exercise prescription today without complaint.  Will continue to monitor for progression.    Dr. Emily Filbert is Medical Director for [place] and [facility].

## 2017-03-19 ENCOUNTER — Encounter: Payer: BLUE CROSS/BLUE SHIELD | Admitting: *Deleted

## 2017-03-19 DIAGNOSIS — I252 Old myocardial infarction: Secondary | ICD-10-CM | POA: Diagnosis not present

## 2017-03-19 DIAGNOSIS — Z955 Presence of coronary angioplasty implant and graft: Secondary | ICD-10-CM

## 2017-03-19 DIAGNOSIS — I213 ST elevation (STEMI) myocardial infarction of unspecified site: Secondary | ICD-10-CM

## 2017-03-19 NOTE — Progress Notes (Signed)
Daily Session Note  Patient Details  Name: Jenna Boyer MRN: [id number] Date of Birth: 02-18-1965 Referring Provider:     Cardiac Rehab from 02/23/2017 in [facility] Cardiac and Pulmonary Rehab  Referring Provider  Neoma Laming MD      Encounter Date: 03/19/2017  Check In: Session Check In - 03/19/17 1650      Check-In   Staff Present  Renita Papa, RN Moises Blood, BS, ACSM CEP, Exercise Physiologist;Amanda Oletta Darter, [place], ACSM CEP, Exercise Physiologist;Joseph Flavia Shipper    Supervising physician immediately available to respond to emergencies  See telemetry face sheet for immediately available ER MD    Medication changes reported      No    Fall or balance concerns reported     No    Warm-up and Cool-down  Performed on first and last piece of equipment    Resistance Training Performed  Yes    VAD Patient?  No      Pain Assessment   Currently in Pain?  No/denies          Social History   Tobacco Use  Smoking Status Never Smoker  Smokeless Tobacco Never Used    Goals Met:  Exercise tolerated well Personal goals reviewed No report of cardiac concerns or symptoms Strength training completed today  Goals Unmet:  Not Applicable  Comments: Doing well with exercise prescription progression. Met with RD today   Dr. Emily Filbert is Medical Director for [place] and [facility].

## 2017-03-23 ENCOUNTER — Encounter: Payer: BLUE CROSS/BLUE SHIELD | Admitting: *Deleted

## 2017-03-23 DIAGNOSIS — I213 ST elevation (STEMI) myocardial infarction of unspecified site: Secondary | ICD-10-CM

## 2017-03-23 DIAGNOSIS — I252 Old myocardial infarction: Secondary | ICD-10-CM | POA: Diagnosis not present

## 2017-03-23 DIAGNOSIS — Z955 Presence of coronary angioplasty implant and graft: Secondary | ICD-10-CM

## 2017-03-23 NOTE — Progress Notes (Signed)
Daily Session Note  Patient Details  Name: Jenna Boyer MRN: [id number] Date of Birth: 1966-01-14 Referring Provider:     Cardiac Rehab from 02/23/2017 in [facility] Cardiac and Pulmonary Rehab  Referring Provider  Neoma Laming MD      Encounter Date: 03/23/2017  Check In: Session Check In - 03/23/17 1647      Check-In   Location  [facility] Re[facility]    Staff Present  Renita Papa, RN Moises Blood, BS, ACSM CEP, Exercise Physiologist;Krista Frederico Hamman, RN Vickki Hearing, BA, ACSM CEP, Exercise Physiologist;Susanne Bice, RN, BSN, [employer] physician immediately available to respond to emergencies  See telemetry face sheet for immediately available ER MD    Medication changes reported      No    Fall or balance concerns reported     No    Warm-up and Cool-down  Performed on first and last piece of equipment    Resistance Training Performed  Yes    VAD Patient?  No      Pain Assessment   Currently in Pain?  No/denies    Multiple Pain Sites  No          Social History   Tobacco Use  Smoking Status Never Smoker  Smokeless Tobacco Never Used    Goals Met:  Independence with exercise equipment Exercise tolerated well Personal goals reviewed No report of cardiac concerns or symptoms Strength training completed today  Goals Unmet:  Not Applicable  Comments: Pt able to follow exercise prescription today without complaint.  Will continue to monitor for progression.    Dr. Emily Filbert is Medical Director for [place] and [facility].

## 2017-03-25 ENCOUNTER — Encounter: Payer: BLUE CROSS/BLUE SHIELD | Admitting: *Deleted

## 2017-03-25 ENCOUNTER — Encounter: Payer: Self-pay | Admitting: *Deleted

## 2017-03-25 DIAGNOSIS — I213 ST elevation (STEMI) myocardial infarction of unspecified site: Secondary | ICD-10-CM

## 2017-03-25 DIAGNOSIS — I252 Old myocardial infarction: Secondary | ICD-10-CM | POA: Diagnosis not present

## 2017-03-25 DIAGNOSIS — Z955 Presence of coronary angioplasty implant and graft: Secondary | ICD-10-CM

## 2017-03-25 NOTE — Progress Notes (Signed)
Daily Session Note  Patient Details  Name: Jenna Boyer MRN: [id number] Date of Birth: 12-Jun-1965 Referring Provider:     Cardiac Rehab from 02/23/2017 in [facility] Cardiac and Pulmonary Rehab  Referring Provider  Neoma Laming MD      Encounter Date: 03/25/2017  Check In: Session Check In - 03/25/17 1717      Check-In   Location  [facility] Re[facility]    Staff Present  Renita Papa, RN Vickki Hearing, BA, ACSM CEP, Exercise Physiologist;Carroll Guy Begin, RN, BSN    Supervising physician immediately available to respond to emergencies  See telemetry face sheet for immediately available ER MD    Medication changes reported      No    Fall or balance concerns reported     No    Warm-up and Cool-down  Performed on first and last piece of equipment    Resistance Training Performed  Yes    VAD Patient?  No      Pain Assessment   Currently in Pain?  No/denies          Social History   Tobacco Use  Smoking Status Never Smoker  Smokeless Tobacco Never Used    Goals Met:  Independence with exercise equipment Exercise tolerated well No report of cardiac concerns or symptoms Strength training completed today  Goals Unmet:  Not Applicable  Comments: Pt able to follow exercise prescription today without complaint.  Will continue to monitor for progression.    Dr. Emily Filbert is Medical Director for [place] and [facility].

## 2017-03-25 NOTE — Progress Notes (Signed)
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 specific guidelines to those with heart or lung disease.    Cardiac Rehab from 03/23/2017 in [facility] Cardiac and Pulmonary Rehab  Date  03/16/17  Educator  [facility]  Instruction Review Code  1- Verbalizes Understanding      Aerobic Exercise & Resistance Training: - Gives group verbal and written instruction on the various components of exercise. Focuses on aerobic and resistive training programs and the benefits of this training and how to safely progress through these programs..   Cardiac Rehab from 03/23/2017 in [facility] Cardiac and Pulmonary Rehab  Date  03/23/17  Educator  [facility]  Instruction Review Code  1- Verbalizes Understanding      Flexibility, Balance, Mind/Body Relaxation: Provides group verbal/written instruction on the benefits of flexibility and balance training, including mind/body exercise modes such as yoga, pilates and tai chi.  Demonstration and skill practice provided.   Stress and  Anxiety: - Provides group verbal and written instruction about the health risks of elevated stress and causes of high stress.  Discuss the correlation between heart/lung disease and anxiety and treatment options. Review healthy ways to manage with stress and anxiety.   Depression: - Provides group verbal and written instruction on the correlation between heart/lung disease and depressed mood, treatment options, and the stigmas associated with seeking treatment.   Cardiac Rehab from 03/23/2017 in [facility] Cardiac and Pulmonary Rehab  Date  03/18/17  Educator  [facility]  Instruction Review Code  1- Verbalizes Understanding      Anatomy & Physiology of the Heart: - Group verbal and written instruction and models provide basic cardiac anatomy and physiology, with the coronary electrical and arterial systems. Review of Valvular disease and Heart Failure   Cardiac Procedures: - Group verbal and written instruction to review commonly prescribed medications for heart disease. Reviews the medication, class of the drug, and side effects. Includes the steps to properly store meds and maintain the prescription regimen. (beta blockers and nitrates)   Cardiac Medications I: - Group verbal and written instruction to review commonly prescribed medications for heart disease. Reviews the medication, class of the drug, and side effects. Includes the steps to properly store meds and maintain the prescription regimen.   Cardiac Medications II: -Group verbal and written instruction to review commonly prescribed medications for heart disease. Reviews the medication, class of the drug, and side effects. (all other drug classes)    Go Sex-Intimacy & Heart Disease, Get SMART - Goal Setting: - Group verbal and written instruction through game format to discuss heart disease and the return to sexual intimacy. Provides group verbal and written material to discuss and apply goal setting through the application of the  S.M.A.R.T. Method.   Other Matters of the Heart: - Provides group verbal, written materials and models to describe Stable Angina and Peripheral Artery. Includes description of the disease process and treatment options available to the cardiac patient.   Exercise & Equipment Safety: - Individual verbal instruction and demonstration of equipment use and safety with use of the equipment.   Cardiac Rehab from 03/23/2017 in [facility] Cardiac and Pulmonary Rehab  Date  02/23/17  Educator  [facility]  Instruction Review Code  1- Verbalizes Understanding      Infection Prevention: - Provides verbal and written material to individual with discussion of infection control including proper hand washing  and proper equipment cleaning during exercise session.   Cardiac Rehab from 03/23/2017 in [facility] Cardiac and Pulmonary Rehab  Date  02/23/17  Educator  [facility]  Instruction Review Code  1- Verbalizes Understanding      Falls Prevention: - Provides verbal and written material to individual with discussion of falls prevention and safety.   Cardiac Rehab from 03/23/2017 in [facility] Cardiac and Pulmonary Rehab  Date  02/23/17  Educator  [facility]  Instruction Review Code  1- Verbalizes Understanding      Diabetes: - Individual verbal and written instruction to review signs/symptoms of diabetes, desired ranges of glucose level fasting, after meals and with exercise. Acknowledge that pre and post exercise glucose checks will be done for 3 sessions at entry of program.   Know Your Numbers and Risk Factors: -Group verbal and written instruction about important numbers in your health.  Discussion of what are risk factors and how they play a role in the disease process.  Review of Cholesterol, Blood Pressure, Diabetes, and BMI and the role they play in your overall health.   Sleep Hygiene: -Provides group verbal and written instruction about how sleep can affect your health.  Define sleep hygiene, discuss sleep cycles and impact  of sleep habits. Review good sleep hygiene tips.    Cardiac Rehab from 03/23/2017 in [facility] Cardiac and Pulmonary Rehab  Date  03/04/17  Educator  [facility]  Instruction Review Code  1- Verbalizes Understanding      Other: -Provides group and verbal instruction on various topics (see comments)   Knowledge Questionnaire Score: Knowledge Questionnaire Score - 02/23/17 1051      Knowledge Questionnaire Score   Pre Score  19/28 correct answers reviewed with Annette       Core Components/Risk Factors/Patient Goals at Admission: Personal Goals and Risk Factors at Admission - 02/23/17 1304      Core Components/Risk Factors/Patient Goals on Admission    Weight Management  Weight Maintenance;Yes    Intervention  Weight Management: Develop a combined nutrition and exercise program designed to reach desired caloric intake, while maintaining appropriate intake of nutrient and fiber, sodium and fats, and appropriate energy expenditure required for the weight goal.;Weight Management: Provide education and appropriate resources to help participant work on and attain dietary goals.    Admit Weight  136 lb (61.7 kg) She has already lost 10 lb without really trying. She wants to maintain around 135 lb    Goal Weight: Short Term  136 lb (61.7 kg)    Goal Weight: Long Term  136 lb (61.7 kg)    Expected Outcomes  Short Term: Continue to assess and modify interventions until short term weight is achieved;Long Term: Adherence to nutrition and physical activity/exercise program aimed toward attainment of established weight goal;Weight Maintenance: Understanding of the daily nutrition guidelines, which includes 25-35% calories from fat, 7% or less cal from saturated fats, less than '200mg'$  cholesterol, less than 1.5gm of sodium, & 5 or more servings of fruits and vegetables daily;Understanding recommendations for meals to include 15-35% energy as protein, 25-35% energy from fat, 35-60% energy from carbohydrates, less than  '200mg'$  of dietary cholesterol, 20-35 gm of total fiber daily;Understanding of distribution of calorie intake throughout the day with the consumption of 4-5 meals/snacks    Hypertension  Yes    Intervention  Provide education on lifestyle modifcations including regular physical activity/exercise, weight management, moderate sodium restriction and increased consumption of fresh fruit, vegetables, and low fat dairy, alcohol moderation, and smoking cessation.;Monitor  prescription use compliance.    Expected Outcomes  Short Term: Continued assessment and intervention until BP is < 140/63m HG in hypertensive participants. < 130/825mHG in hypertensive participants with diabetes, heart failure or chronic kidney disease.;Long Term: Maintenance of blood pressure at goal levels.    Lipids  Yes    Intervention  Provide education and support for participant on nutrition & aerobic/resistive exercise along with prescribed medications to achieve LDL '70mg'$ , HDL >'40mg'$ .    Expected Outcomes  Short Term: Participant states understanding of desired cholesterol values and is compliant with medications prescribed. Participant is following exercise prescription and nutrition guidelines.;Long Term: Cholesterol controlled with medications as prescribed, with individualized exercise RX and with personalized nutrition plan. Value goals: LDL < '70mg'$ , HDL > 40 mg.    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 She has been caring for her mom for the past 7 years, she started right after her husband passed away. Her mother's health is steadily declining and it is hard to focus on herself.     Intervention  Offer individual and/or small group education and counseling on adjustment to heart disease, stress management and health-related lifestyle change. Teach and support self-help strategies.;Refer participants experiencing significant psychosocial distress to appropriate mental health specialists for  further evaluation and treatment. When possible, include family members and significant others in education/counseling sessions.    Expected Outcomes  Short Term: Participant demonstrates changes in health-related behavior, relaxation and other stress management skills, ability to obtain effective social support, and compliance with psychotropic medications if prescribed.;Long Term: Emotional wellbeing is indicated by absence of clinically significant psychosocial distress or social isolation.       Core Components/Risk Factors/Patient Goals Review:  Goals and Risk Factor Review    Row Name 03/23/17 1620             Core Components/Risk Factors/Patient Goals Review   Personal Goals Review  Weight Management/Obesity;Lipids;Hypertension       Review  TrKaydences taking all her medications as prescribed, attending caridac rehab regularly,  and is managing her weight, blood pressure, and lipids.        Expected Outcomes  Short: TrDesheaill continue regular attendance to cardiac rehab. Long: TrKeneciaill adopt healthy exercise and nutrician habits that will continue to aid in risk factor modification.            Core Components/Risk Factors/Patient Goals at Discharge (Final Review):  Goals and Risk Factor Review - 03/23/17 1620      Core Components/Risk Factors/Patient Goals Review   Personal Goals Review  Weight Management/Obesity;Lipids;Hypertension    Review  TrDiamantes taking all her medications as prescribed, attending caridac rehab regularly,  and is managing her weight, blood pressure, and lipids.     Expected Outcomes  Short: TrShaughnessyill continue regular attendance to cardiac rehab. Long: TrVladaill adopt healthy exercise and nutrician habits that will continue to aid in risk factor modification.         ITP Comments: ITP Comments    Row Name 02/23/17 1254 02/25/17 0648 03/19/17 1651 03/25/17 0604     ITP Comments  Med Review completed. Initial ITP created. [facility]  30 Day review. Continue with ITP unless directed changes per Medical Director review.  New to program  Met with RD today  30 day review. Continue with ITP unless directed changes per Medical Director review.  Comments:

## 2017-03-26 DIAGNOSIS — I213 ST elevation (STEMI) myocardial infarction of unspecified site: Secondary | ICD-10-CM

## 2017-03-26 DIAGNOSIS — I252 Old myocardial infarction: Secondary | ICD-10-CM | POA: Diagnosis not present

## 2017-03-26 NOTE — Progress Notes (Signed)
Daily Session Note  Patient Details  Name: Jenna Boyer MRN: [id number] Date of Birth: [birth year] Referring Provider:     Cardiac Rehab from 02/23/2017 in [facility] Cardiac and Pulmonary Rehab  Referring Provider  Neoma Laming MD      Encounter Date: 03/26/2017  Check In: Session Check In - 03/26/17 1710      Check-In   Location  [facility]-Cardiac & Pulmonary Rehab    Staff Present  Earlean Shawl, BS, ACSM CEP, Exercise Physiologist;Meredith Sherryll Burger, RN BSN;Luna Audia Flavia Shipper    Supervising physician immediately available to respond to emergencies  See telemetry face sheet for immediately available ER MD    Medication changes reported      No    Fall or balance concerns reported     No    Warm-up and Cool-down  Performed on first and last piece of equipment    Resistance Training Performed  Yes    VAD Patient?  No      Pain Assessment   Currently in Pain?  No/denies          Social History   Tobacco Use  Smoking Status Never Smoker  Smokeless Tobacco Never Used    Goals Met:  Independence with exercise equipment Exercise tolerated well No report of cardiac concerns or symptoms Strength training completed today  Goals Unmet:  Not Applicable  Comments: Reviewed home exercise with pt today.  Pt plans to use a bike and walk at home for exercise.  Reviewed THR, pulse, RPE, sign and symptoms, NTG use, and when to call 911 or MD.  Also discussed weather considerations and indoor options.  Pt voiced understanding.Pt able to follow exercise prescription today without complaint.  Will continue to monitor for progression.   Dr. Emily Filbert is Medical Director for [place] and [facility].

## 2017-03-30 ENCOUNTER — Encounter: Payer: BLUE CROSS/BLUE SHIELD | Attending: Cardiovascular Disease

## 2017-03-30 DIAGNOSIS — Z7902 Long term (current) use of antithrombotics/antiplatelets: Secondary | ICD-10-CM | POA: Diagnosis not present

## 2017-03-30 DIAGNOSIS — Z7982 Long term (current) use of aspirin: Secondary | ICD-10-CM | POA: Diagnosis not present

## 2017-03-30 DIAGNOSIS — I10 Essential (primary) hypertension: Secondary | ICD-10-CM | POA: Insufficient documentation

## 2017-03-30 DIAGNOSIS — E785 Hyperlipidemia, unspecified: Secondary | ICD-10-CM | POA: Diagnosis not present

## 2017-03-30 DIAGNOSIS — Z79899 Other long term (current) drug therapy: Secondary | ICD-10-CM | POA: Insufficient documentation

## 2017-03-30 DIAGNOSIS — Z955 Presence of coronary angioplasty implant and graft: Secondary | ICD-10-CM | POA: Diagnosis not present

## 2017-03-30 DIAGNOSIS — I252 Old myocardial infarction: Secondary | ICD-10-CM | POA: Insufficient documentation

## 2017-03-30 DIAGNOSIS — I213 ST elevation (STEMI) myocardial infarction of unspecified site: Secondary | ICD-10-CM

## 2017-03-30 NOTE — Progress Notes (Signed)
Daily Session Note  Patient Details  Name: Jenna Boyer MRN: [id number] Date of Birth: 05/17/1965 Referring Provider:     Cardiac Rehab from 02/23/2017 in [facility] Cardiac and Pulmonary Rehab  Referring Provider  Neoma Laming MD      Encounter Date: 03/30/2017  Check In: Session Check In - 03/30/17 1729      Check-In   Location  [facility]-Cardiac & Pulmonary Rehab    Staff Present  Renita Papa, RN Moises Blood, BS, ACSM CEP, Exercise Physiologist;Kostas Marrow Oletta Darter, BA, ACSM CEP, Exercise Physiologist;Carroll Enterkin, RN, BSN    Supervising physician immediately available to respond to emergencies  See telemetry face sheet for immediately available ER MD    Medication changes reported      No    Fall or balance concerns reported     No    Warm-up and Cool-down  Performed on first and last piece of equipment    Resistance Training Performed  Yes    VAD Patient?  No      Pain Assessment   Currently in Pain?  No/denies    Multiple Pain Sites  No          Social History   Tobacco Use  Smoking Status Never Smoker  Smokeless Tobacco Never Used    Goals Met:  Independence with exercise equipment Exercise tolerated well No report of cardiac concerns or symptoms Strength training completed today  Goals Unmet:  Not Applicable  Comments: Pt able to follow exercise prescription today without complaint.  Will continue to monitor for progression.    Dr. Emily Filbert is Medical Director for [place] and [facility].

## 2017-04-01 ENCOUNTER — Encounter: Payer: BLUE CROSS/BLUE SHIELD | Admitting: *Deleted

## 2017-04-01 DIAGNOSIS — I213 ST elevation (STEMI) myocardial infarction of unspecified site: Secondary | ICD-10-CM

## 2017-04-01 DIAGNOSIS — Z955 Presence of coronary angioplasty implant and graft: Secondary | ICD-10-CM

## 2017-04-01 DIAGNOSIS — I252 Old myocardial infarction: Secondary | ICD-10-CM | POA: Diagnosis not present

## 2017-04-01 NOTE — Progress Notes (Signed)
Daily Session Note  Patient Details  Name: Jenna Boyer MRN: [id number] Date of Birth: [birth year] Referring Provider:     Cardiac Rehab from 02/23/2017 in [facility] Cardiac and Pulmonary Rehab  Referring Provider  Neoma Laming MD      Encounter Date: 04/01/2017  Check In: Session Check In - 04/01/17 1643      Check-In   Location  [facility]-Cardiac & Pulmonary Rehab    Staff Present  Renita Papa, RN Vickki Hearing, BA, ACSM CEP, Exercise Physiologist;Carroll Enterkin, RN, BSN    Supervising physician immediately available to respond to emergencies  See telemetry face sheet for immediately available ER MD    Medication changes reported      No    Fall or balance concerns reported     No    Warm-up and Cool-down  Performed on first and last piece of equipment    Resistance Training Performed  Yes    VAD Patient?  No      Pain Assessment   Currently in Pain?  No/denies        Exercise Prescription Changes - 04/01/17 1300      Response to Exercise   Blood Pressure (Admit)  118/62    Blood Pressure (Exercise)  138/68    Blood Pressure (Exit)  98/54    Heart Rate (Admit)  70 bpm    Heart Rate (Exercise)  133 bpm    Heart Rate (Exit)  69 bpm    Rating of Perceived Exertion (Exercise)  14    Symptoms  none      Progression   Progression  Continue to progress workloads to maintain intensity without signs/symptoms of physical distress.    Average METs  4.3      Resistance Training   Training Prescription  Yes    Weight  4 lb    Reps  10-15      Interval Training   Interval Training  No      Treadmill   MPH  2.8    Grade  3    Minutes  15    METs  4.3      NuStep   Level  4    SPM  80    Minutes  15      Home Exercise Plan   Plans to continue exercise at  Home (comment) walk and Bike    Frequency  Add 1 additional day to program exercise sessions.    Initial Home Exercises Provided  03/26/17       Social History   Tobacco Use  Smoking Status Never Smoker   Smokeless Tobacco Never Used    Goals Met:  Independence with exercise equipment Exercise tolerated well No report of cardiac concerns or symptoms Strength training completed today  Goals Unmet:  Not Applicable  Comments: Pt able to follow exercise prescription today without complaint.  Will continue to monitor for progression.    Dr. Emily Filbert is Medical Director for [place] and [facility].

## 2017-04-02 ENCOUNTER — Encounter: Payer: BLUE CROSS/BLUE SHIELD | Admitting: *Deleted

## 2017-04-02 DIAGNOSIS — I213 ST elevation (STEMI) myocardial infarction of unspecified site: Secondary | ICD-10-CM

## 2017-04-02 DIAGNOSIS — Z955 Presence of coronary angioplasty implant and graft: Secondary | ICD-10-CM

## 2017-04-02 DIAGNOSIS — I252 Old myocardial infarction: Secondary | ICD-10-CM | POA: Diagnosis not present

## 2017-04-02 NOTE — Progress Notes (Signed)
Daily Session Note  Patient Details  Name: Jenna Boyer MRN: [id number] Date of Birth: 1965/04/15 Referring Provider:     Cardiac Rehab from 02/23/2017 in [facility] Cardiac and Pulmonary Rehab  Referring Provider  Neoma Laming MD      Encounter Date: 04/02/2017  Check In: Session Check In - 04/02/17 1715      Check-In   Location  [facility]-Cardiac & Pulmonary Rehab    Staff Present  Renita Papa, RN Moises Blood, BS, ACSM CEP, Exercise Physiologist;Mary Kellie Shropshire, RN, BSN, MA    Supervising physician immediately available to respond to emergencies  See telemetry face sheet for immediately available ER MD    Medication changes reported      No    Fall or balance concerns reported     No    Warm-up and Cool-down  Performed on first and last piece of equipment    Resistance Training Performed  Yes    VAD Patient?  No      Pain Assessment   Currently in Pain?  No/denies          Social History   Tobacco Use  Smoking Status Never Smoker  Smokeless Tobacco Never Used    Goals Met:  Independence with exercise equipment Exercise tolerated well No report of cardiac concerns or symptoms Strength training completed today  Goals Unmet:  Not Applicable  Comments: Pt able to follow exercise prescription today without complaint.  Will continue to monitor for progression.    Dr. Emily Filbert is Medical Director for [place] and [facility].

## 2017-04-06 ENCOUNTER — Encounter: Payer: BLUE CROSS/BLUE SHIELD | Admitting: *Deleted

## 2017-04-06 DIAGNOSIS — I252 Old myocardial infarction: Secondary | ICD-10-CM | POA: Diagnosis not present

## 2017-04-06 DIAGNOSIS — Z955 Presence of coronary angioplasty implant and graft: Secondary | ICD-10-CM

## 2017-04-06 DIAGNOSIS — I213 ST elevation (STEMI) myocardial infarction of unspecified site: Secondary | ICD-10-CM

## 2017-04-06 NOTE — Progress Notes (Signed)
Daily Session Note  Patient Details  Name: Jenna Boyer MRN: [id number] Date of Birth: [birth year] Referring Provider:     Cardiac Rehab from 02/23/2017 in [facility] Cardiac and Pulmonary Rehab  Referring Provider  Neoma Laming MD      Encounter Date: 04/06/2017  Check In: Session Check In - 04/06/17 1733      Check-In   Location  [facility]-Cardiac & Pulmonary Rehab    Staff Present  Renita Papa, RN Moises Blood, BS, ACSM CEP, Exercise Physiologist;Amanda Oletta Darter, BA, ACSM CEP, Exercise Physiologist;Carroll Enterkin, RN, BSN    Supervising physician immediately available to respond to emergencies  See telemetry face sheet for immediately available ER MD    Medication changes reported      No    Fall or balance concerns reported     No    Warm-up and Cool-down  Performed on first and last piece of equipment    Resistance Training Performed  Yes    VAD Patient?  No      Pain Assessment   Currently in Pain?  No/denies    Multiple Pain Sites  No          Social History   Tobacco Use  Smoking Status Never Smoker  Smokeless Tobacco Never Used    Goals Met:  Independence with exercise equipment Exercise tolerated well No report of cardiac concerns or symptoms Strength training completed today  Goals Unmet:  Not Applicable  Comments: Pt able to follow exercise prescription today without complaint.  Will continue to monitor for progression.    Dr. Emily Filbert is Medical Director for [place] and [facility].

## 2017-04-08 ENCOUNTER — Encounter: Payer: Self-pay | Admitting: *Deleted

## 2017-04-08 DIAGNOSIS — I213 ST elevation (STEMI) myocardial infarction of unspecified site: Secondary | ICD-10-CM

## 2017-04-08 DIAGNOSIS — I252 Old myocardial infarction: Secondary | ICD-10-CM | POA: Diagnosis not present

## 2017-04-08 DIAGNOSIS — Z955 Presence of coronary angioplasty implant and graft: Secondary | ICD-10-CM

## 2017-04-08 NOTE — Progress Notes (Signed)
Daily Session Note  Patient Details  Name: Karthika Glasper MRN: [id number] Date of Birth: 02-09-1965 Referring Provider:     Cardiac Rehab from 02/23/2017 in [facility] Cardiac and Pulmonary Rehab  Referring Provider  Neoma Laming MD      Encounter Date: 04/08/2017  Check In: Session Check In - 04/08/17 1633      Check-In   Staff Present  Gerlene Burdock, RN, BSN;Meredith Sherryll Burger, RN Vickki Hearing, BA, ACSM CEP, Exercise Physiologist    Supervising physician immediately available to respond to emergencies  See telemetry face sheet for immediately available ER MD    Medication changes reported      No    Fall or balance concerns reported     No    Tobacco Cessation  No Change    Warm-up and Cool-down  Performed on first and last piece of equipment    Resistance Training Performed  Yes    VAD Patient?  No      Pain Assessment   Currently in Pain?  No/denies          Social History   Tobacco Use  Smoking Status Never Smoker  Smokeless Tobacco Never Used    Goals Met:  Proper associated with RPD/PD & O2 Sat Exercise tolerated well  Goals Unmet:  Not Applicable  Comments:     Dr. Emily Filbert is Medical Director for [place] and [facility].

## 2017-04-08 NOTE — Progress Notes (Signed)
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 realizing she can hang up when they are not being kind.  Reeta has made some good decisions for her own health and that of her mother.  Therapist commended her for positive self-care and encouraged continued practice of stress management strategies during this time.    Jenna Boyer is practicing positive self care and is experiencing "more peace."  She will continue to do this and practice stress management strategies with setting healthy boundaries and limits with others.    Shonta will continue exercising consistently, and will be proactive with retrieving her vehicle from the lady who has it.  She will practice better sleep hygiene as well.    Exercsisng has been helping her.     Interventions  Relaxation education;Stress management education  Stress management education  Stress management education;Relaxation education  -    Continue Psychosocial Services   Follow up required by counselor  Follow up required by counselor  Follow up required by staff  -        Psychosocial Discharge (Final Psychosocial Re-Evaluation): Psychosocial Re-Evaluation - 04/08/17 1642      Psychosocial Re-Evaluation   Comments  Jenna Boyer still has some stress about someone who helped her take care of her Mother before. Her Mother is in a nursing home.     Expected Outcomes  Exercsisng has been helping her.        Education: Education Goals: Education classes will be provided on a weekly basis, covering required topics. Participant will state understanding/return demonstration of topics presented.  Learning Barriers/Preferences: Learning Barriers/Preferences - 02/23/17 1314      Learning Barriers/Preferences   Learning Barriers  None    Learning Preferences  None       Education Topics:  Initial Evaluation Education: - Verbal, written and demonstration of respiratory meds, oximetry and breathing techniques. Instruction on use of nebulizers and MDIs and importance of monitoring MDI activations.   General Nutrition Guidelines/Fats and Fiber: -Group instruction provided by verbal, written material, models and posters to present the general guidelines for heart healthy nutrition. Gives an explanation and review of dietary fats and fiber.   Controlling Sodium/Reading Food Labels: -Group verbal and written material supporting the discussion of sodium use in heart healthy nutrition. Review and explanation with models, verbal and written materials for utilization of the food label.   Cardiac Rehab from 04/08/2017 in [facility] Cardiac and Pulmonary Rehab  Date  03/02/17  Educator  PI  Instruction Review Code  1- Verbalizes Understanding      Exercise Physiology & General Exercise Guidelines: - Group verbal and written instruction with models to review the exercise physiology of the cardiovascular system and associated critical values. Provides general exercise guidelines with specific guidelines to those with heart or lung disease.    Cardiac Rehab from 04/08/2017 in  [facility]  Cardiac and Pulmonary Rehab  Date  03/16/17  Educator  [facility]  Instruction Review Code  1- Verbalizes Understanding      Aerobic Exercise & Resistance Training: - Gives group verbal and written instruction on the various components of exercise. Focuses on aerobic and resistive training programs and the benefits of this training and how to safely progress through these programs.   Cardiac Rehab from 04/08/2017 in [facility] Cardiac and Pulmonary Rehab  Date  03/23/17  Educator  AS  Instruction Review Code  1- Verbalizes Understanding      Flexibility, Balance, Mind/Body Relaxation: Provides group verbal/written instruction on the benefits of flexibility and balance training, including mind/body exercise modes such as yoga, pilates and tai chi.  Demonstration and skill practice provided.   Cardiac Rehab from 04/08/2017 in [facility] Cardiac and Pulmonary Rehab  Date  03/25/17  Educator  AS  Instruction Review Code  1- Verbalizes Understanding      Stress and Anxiety: - Provides group verbal and written instruction about the health risks of elevated stress and causes of high stress.  Discuss the correlation between heart/lung disease and anxiety and treatment options. Review healthy ways to manage with stress and anxiety.   Cardiac Rehab from 04/08/2017 in [facility] Cardiac and Pulmonary Rehab  Date  04/01/17  Educator  AS  Instruction Review Code  1- Verbalizes Understanding      Depression: - Provides group verbal and written instruction on the correlation between heart/lung disease and depressed mood, treatment options, and the stigmas associated with seeking treatment.   Cardiac Rehab from 04/08/2017 in [facility] Cardiac and Pulmonary Rehab  Date  03/18/17  Educator  [facility]  Instruction Review Code  1- Verbalizes Understanding      Exercise & Equipment Safety: - Individual verbal instruction and demonstration of equipment use and safety with use of the equipment.   Cardiac Rehab from 04/08/2017 in  [facility] Cardiac and Pulmonary Rehab  Date  02/23/17  Educator  [facility]  Instruction Review Code  1- Verbalizes Understanding      Infection Prevention: - Provides verbal and written material to individual with discussion of infection control including proper hand washing and proper equipment cleaning during exercise session.   Cardiac Rehab from 04/08/2017 in [facility] Cardiac and Pulmonary Rehab  Date  02/23/17  Educator  [facility]  Instruction Review Code  1- Verbalizes Understanding      Falls Prevention: - Provides verbal and written material to individual with discussion of falls prevention and safety.   Cardiac Rehab from 04/08/2017 in [facility] Cardiac and Pulmonary Rehab  Date  02/23/17  Educator  [facility]  Instruction Review Code  1- Verbalizes Understanding      Diabetes: - Individual verbal and written instruction to review signs/symptoms of diabetes, desired ranges of glucose level fasting, after meals and with exercise. Advice that pre and post exercise glucose checks will be done for 3 sessions at entry of program.   Chronic Lung Diseases: - Group verbal and written instruction to review updates, respiratory medications, advancements in procedures and treatments. Discuss use of supplemental oxygen including available portable oxygen systems, continuous and intermittent flow rates, concentrators, personal use and safety guidelines. Review proper use of inhaler and spacers. Provide informative websites for self-education.    Energy Conservation: - Provide group verbal and written instruction for methods to conserve energy, plan and organize activities. Instruct on pacing techniques, use of adaptive equipment and posture/positioning to relieve shortness of breath.   Triggers and Exacerbations: - Group verbal  and written instruction to review types of environmental triggers and ways to prevent exacerbations. Discuss weather changes, air quality and the benefits of nasal washing. Review warning signs  and symptoms to help prevent infections. Discuss techniques for effective airway clearance, coughing, and vibrations.   AED/CPR: - Group verbal and written instruction with the use of models to demonstrate the basic use of the AED with the basic ABC's of resuscitation.   Cardiac Rehab from 04/08/2017 in [facility] Cardiac and Pulmonary Rehab  Date  03/09/17  Educator  MA  Instruction Review Code  1- Verbalizes Understanding      Anatomy and Physiology of the Lungs: - Group verbal and written instruction with the use of models to provide basic lung anatomy and physiology related to function, structure and complications of lung disease.   Anatomy & Physiology of the Heart: - Group verbal and written instruction and models provide basic cardiac anatomy and physiology, with the coronary electrical and arterial systems. Review of Valvular disease and Heart Failure   Cardiac Medications: - Group verbal and written instruction to review commonly prescribed medications for heart disease. Reviews the medication, class of the drug, and side effects.   Cardiac Rehab from 04/08/2017 in [facility] Cardiac and Pulmonary Rehab  Date  04/06/17 [04/08/2017 Cardiac meds part 2 C.Kaylamarie Swickard,RN]  Educator  CE  Instruction Review Code  1- Verbalizes Understanding      Know Your Numbers and Risk Factors: -Group verbal and written instruction about important numbers in your health.  Discussion of what are risk factors and how they play a role in the disease process.  Review of Cholesterol, Blood Pressure, Diabetes, and BMI and the role they play in your overall health.   Cardiac Rehab from 04/08/2017 in [facility] Cardiac and Pulmonary Rehab  Date  03/30/17  Educator  CE  Instruction Review Code  1- Verbalizes Understanding      Sleep Hygiene: -Provides group verbal and written instruction about how sleep can affect your health.  Define sleep hygiene, discuss sleep cycles and impact of sleep habits. Review good sleep  hygiene tips.    Cardiac Rehab from 04/08/2017 in [facility] Cardiac and Pulmonary Rehab  Date  03/04/17  Educator  [facility]  Instruction Review Code  1- Verbalizes Understanding      Other: -Provides group and verbal instruction on various topics (see comments)    Knowledge Questionnaire Score: Knowledge Questionnaire Score - 02/23/17 1051      Knowledge Questionnaire Score   Pre Score  19/28 correct answers reviewed with Ryleeann        Core Components/Risk Factors/Patient Goals at Admission: Personal Goals and Risk Factors at Admission - 04/08/17 1640      Core Components/Risk Factors/Patient Goals on Admission    Weight Management  Yes       Core Components/Risk Factors/Patient Goals Review:  Goals and Risk Factor Review    Row Name 03/23/17 1620 04/08/17 1640 04/08/17 1641         Core Components/Risk Factors/Patient Goals Review   Personal Goals Review  Weight Management/Obesity;Lipids;Hypertension  Weight Management/Obesity  Lipids;Hypertension;Weight Management/Obesity     Review  Carly is taking all her medications as prescribed, attending caridac rehab regularly,  and is managing her weight, blood pressure, and lipids.   -  weight 134lbs today. Her blood pressure has been good at 116/60.      Expected Outcomes  Short: Benicia will continue regular attendance to cardiac rehab. Long: Royelle will adopt healthy exercise and nutrician habits  that will continue to aid in risk factor modification.    -  Francyne has been faithful to attend Cardiac Rehab.        Core Components/Risk Factors/Patient Goals at Discharge (Final Review):  Goals and Risk Factor Review - 04/08/17 1641      Core Components/Risk Factors/Patient Goals Review   Personal Goals Review  Lipids;Hypertension;Weight Management/Obesity    Review  weight 134lbs today. Her blood pressure has been good at 116/60.     Expected Outcomes  Keyunna has been faithful to attend Cardiac Rehab.       ITP Comments: ITP  Comments    Row Name 02/23/17 1254 02/25/17 0648 03/19/17 1651 03/25/17 0604     ITP Comments  Med Review completed. Initial ITP created. Diagnosis can be found in [facility] encounter 01/25/17  30 Day review. Continue with ITP unless directed changes per Medical Director review.  New to program  Met with RD today  30 day review. Continue with ITP unless directed changes per Medical Director review.       Comments:

## 2017-04-09 DIAGNOSIS — I213 ST elevation (STEMI) myocardial infarction of unspecified site: Secondary | ICD-10-CM

## 2017-04-09 DIAGNOSIS — I252 Old myocardial infarction: Secondary | ICD-10-CM | POA: Diagnosis not present

## 2017-04-09 NOTE — Progress Notes (Signed)
Daily Session Note  Patient Details  Name: Jenna Boyer MRN: [id number] Date of Birth: Mar 11, 1965 Referring Provider:     Cardiac Rehab from 02/23/2017 in [facility] Cardiac and Pulmonary Rehab  Referring Provider  Neoma Laming MD      Encounter Date: 04/09/2017  Check In: Session Check In - 04/09/17 1635      Check-In   Location  [facility]-Cardiac & Pulmonary Rehab    Staff Present  Earlean Shawl, BS, ACSM CEP, Exercise Physiologist;Meredith Sherryll Burger, RN BSN;Felissa Blouch Flavia Shipper    Supervising physician immediately available to respond to emergencies  See telemetry face sheet for immediately available ER MD    Medication changes reported      No    Fall or balance concerns reported     No    Tobacco Cessation  No Change    Warm-up and Cool-down  Performed on first and last piece of equipment    Resistance Training Performed  Yes    VAD Patient?  No      Pain Assessment   Currently in Pain?  No/denies          Social History   Tobacco Use  Smoking Status Never Smoker  Smokeless Tobacco Never Used    Goals Met:  Independence with exercise equipment Exercise tolerated well No report of cardiac concerns or symptoms Strength training completed today  Goals Unmet:  Not Applicable  Comments: Pt able to follow exercise prescription today without complaint.  Will continue to monitor for progression.   Dr. Emily Filbert is Medical Director for [place] and [facility].

## 2017-04-13 DIAGNOSIS — I252 Old myocardial infarction: Secondary | ICD-10-CM | POA: Diagnosis not present

## 2017-04-13 DIAGNOSIS — I213 ST elevation (STEMI) myocardial infarction of unspecified site: Secondary | ICD-10-CM

## 2017-04-13 DIAGNOSIS — Z955 Presence of coronary angioplasty implant and graft: Secondary | ICD-10-CM

## 2017-04-13 NOTE — Progress Notes (Signed)
Daily Session Note  Patient Details  Name: Jenna Boyer MRN: [id number] Date of Birth: 11/23/1965 Referring Provider:     Cardiac Rehab from 02/23/2017 in [facility] Cardiac and Pulmonary Rehab  Referring Provider  Khan, Shaukat MD      Encounter Date: 04/13/2017  Check In: Session Check In - 04/13/17 1724      Check-In   Location  [facility]-Cardiac & Pulmonary Rehab    Staff Present  Meredith Craven, RN BSN;Kelly Hayes, BS, ACSM CEP, Exercise Physiologist;Amanda Sommer, BA, ACSM CEP, Exercise Physiologist;Carroll Enterkin, RN, BSN    Supervising physician immediately available to respond to emergencies  See telemetry face sheet for immediately available ER MD    Medication changes reported      No    Fall or balance concerns reported     No    Warm-up and Cool-down  Performed on first and last piece of equipment    Resistance Training Performed  Yes    VAD Patient?  No      Pain Assessment   Currently in Pain?  No/denies    Multiple Pain Sites  No          Social History   Tobacco Use  Smoking Status Never Smoker  Smokeless Tobacco Never Used    Goals Met:  Independence with exercise equipment Exercise tolerated well No report of cardiac concerns or symptoms Strength training completed today  Goals Unmet:  Not Applicable  Comments: Pt able to follow exercise prescription today without complaint.  Will continue to monitor for progression.    Dr. Mark Miller is Medical Director for [facility] and [facility]. 

## 2017-04-15 ENCOUNTER — Encounter: Payer: Self-pay | Admitting: *Deleted

## 2017-04-15 ENCOUNTER — Encounter: Payer: BLUE CROSS/BLUE SHIELD | Admitting: *Deleted

## 2017-04-15 VITALS — Ht 62.1 in | Wt 133.0 lb

## 2017-04-15 DIAGNOSIS — I252 Old myocardial infarction: Secondary | ICD-10-CM | POA: Diagnosis not present

## 2017-04-15 DIAGNOSIS — Z955 Presence of coronary angioplasty implant and graft: Secondary | ICD-10-CM

## 2017-04-15 DIAGNOSIS — I213 ST elevation (STEMI) myocardial infarction of unspecified site: Secondary | ICD-10-CM

## 2017-04-15 NOTE — Progress Notes (Signed)
Daily Session Note  Patient Details  Name: Jenna Boyer MRN: [id number] Date of Birth: 20-Oct-1965 Referring Provider:     Cardiac Rehab from 02/23/2017 in [facility] Cardiac and Pulmonary Rehab  Referring Provider  Neoma Laming MD      Encounter Date: 04/15/2017  Check In: Session Check In - 04/15/17 1645      Check-In   Location  [facility]-Cardiac & Pulmonary Rehab    Staff Present  Renita Papa, RN Vickki Hearing, BA, ACSM CEP, Exercise Physiologist;Johan Antonacci, RN, BSN    Supervising physician immediately available to respond to emergencies  See telemetry face sheet for immediately available ER MD    Medication changes reported      No    Tobacco Cessation  No Change    Warm-up and Cool-down  Performed on first and last piece of equipment    Resistance Training Performed  Yes    VAD Patient?  No      Pain Assessment   Currently in Pain?  No/denies        Exercise Prescription Changes - 04/15/17 1500      Response to Exercise   Blood Pressure (Admit)  108/60    Blood Pressure (Exercise)  170/68    Blood Pressure (Exit)  98/56    Heart Rate (Admit)  75 bpm    Heart Rate (Exercise)  121 bpm    Heart Rate (Exit)  72 bpm    Rating of Perceived Exertion (Exercise)  12    Symptoms  none    Duration  Continue with 45 min of aerobic exercise without signs/symptoms of physical distress.    Intensity  THRR unchanged      Progression   Progression  Continue to progress workloads to maintain intensity without signs/symptoms of physical distress.    Average METs  5.1      Resistance Training   Training Prescription  Yes    Weight  4 lb    Reps  10-15      Interval Training   Interval Training  No      Treadmill   MPH  3.4    Grade  3    Minutes  15    METs  5.01      NuStep   Level  4    SPM  80    Minutes  15    METs  5.2      Home Exercise Plan   Plans to continue exercise at  Home (comment) walk and Bike    Frequency  Add 1 additional day to program  exercise sessions.    Initial Home Exercises Provided  03/26/17       Social History   Tobacco Use  Smoking Status Never Smoker  Smokeless Tobacco Never Used    Goals Met:  Proper associated with RPD/PD & O2 Sat Exercise tolerated well No report of cardiac concerns or symptoms Strength training completed today  Goals Unmet:  Not Applicable  Comments:     Dr. Emily Filbert is Medical Director for [place] and [facility].

## 2017-04-16 DIAGNOSIS — I252 Old myocardial infarction: Secondary | ICD-10-CM | POA: Diagnosis not present

## 2017-04-16 DIAGNOSIS — I213 ST elevation (STEMI) myocardial infarction of unspecified site: Secondary | ICD-10-CM

## 2017-04-16 NOTE — Progress Notes (Signed)
Daily Session Note  Patient Details  Name: Jenna Boyer MRN: [id number] Date of Birth: Nov 28, 1965 Referring Provider:     Cardiac Rehab from 02/23/2017 in [facility] Cardiac and Pulmonary Rehab  Referring Provider  Neoma Laming MD      Encounter Date: 04/16/2017  Check In: Session Check In - 04/16/17 1718      Check-In   Location  [facility]-Cardiac & Pulmonary Rehab    Staff Present  Earlean Shawl, BS, ACSM CEP, Exercise Physiologist;Meredith Sherryll Burger, RN BSN;Joseph Flavia Shipper    Supervising physician immediately available to respond to emergencies  See telemetry face sheet for immediately available ER MD    Medication changes reported      No    Fall or balance concerns reported     No    Tobacco Cessation  No Change    Warm-up and Cool-down  Performed on first and last piece of equipment    Resistance Training Performed  Yes    VAD Patient?  No      Pain Assessment   Currently in Pain?  No/denies          Social History   Tobacco Use  Smoking Status Never Smoker  Smokeless Tobacco Never Used    Goals Met:  Independence with exercise equipment Exercise tolerated well No report of cardiac concerns or symptoms Strength training completed today  Goals Unmet:  Not Applicable  Comments: Pt able to follow exercise prescription today without complaint.  Will continue to monitor for progression.   Dr. Emily Filbert is Medical Director for [place] and [facility].

## 2017-04-20 ENCOUNTER — Encounter: Payer: BLUE CROSS/BLUE SHIELD | Admitting: *Deleted

## 2017-04-20 DIAGNOSIS — I213 ST elevation (STEMI) myocardial infarction of unspecified site: Secondary | ICD-10-CM

## 2017-04-20 DIAGNOSIS — I252 Old myocardial infarction: Secondary | ICD-10-CM | POA: Diagnosis not present

## 2017-04-20 DIAGNOSIS — Z955 Presence of coronary angioplasty implant and graft: Secondary | ICD-10-CM

## 2017-04-20 NOTE — Patient Instructions (Signed)
Discharge Patient Instructions  Patient Details  Name: Jenna Boyer MRN: [id number] Date of Birth: 02/20/1965 Referring Provider:  Dionisio David, MD   Number of Visits: 47  Reason for Discharge:  Patient reached a stable level of exercise. Patient independent in their exercise. Patient has met program and personal goals.  Smoking History:  Social History   Tobacco Use  Smoking Status Never Smoker  Smokeless Tobacco Never Used    Diagnosis:  ST elevation myocardial infarction (STEMI), unspecified artery ([place])  Initial Exercise Prescription: Initial Exercise Prescription - 02/23/17 1400      Date of Initial Exercise RX and Referring Provider   Date  02/23/17    Referring Provider  Neoma Laming MD      Treadmill   MPH  2.3    Grade  3    Minutes  15    METs  3.71      NuStep   Level  4    SPM  80    Minutes  15    METs  3.7      Elliptical   Level  1    Speed  3.9    Minutes  15      Prescription Details   Frequency (times per week)  3    Duration  Progress to 45 minutes of aerobic exercise without signs/symptoms of physical distress      Intensity   THRR 40-80% of Max Heartrate  107-150    Ratings of Perceived Exertion  11-13    Perceived Dyspnea  0-4      Progression   Progression  Continue to progress workloads to maintain intensity without signs/symptoms of physical distress.      Resistance Training   Training Prescription  Yes    Weight  4 lbs    Reps  10-15       Discharge Exercise Prescription (Final Exercise Prescription Changes): Exercise Prescription Changes - 04/15/17 1500      Response to Exercise   Blood Pressure (Admit)  108/60    Blood Pressure (Exercise)  170/68    Blood Pressure (Exit)  98/56    Heart Rate (Admit)  75 bpm    Heart Rate (Exercise)  121 bpm    Heart Rate (Exit)  72 bpm    Rating of Perceived Exertion (Exercise)  12    Symptoms  none    Duration  Continue with 45 min of aerobic exercise without  signs/symptoms of physical distress.    Intensity  THRR unchanged      Progression   Progression  Continue to progress workloads to maintain intensity without signs/symptoms of physical distress.    Average METs  5.1      Resistance Training   Training Prescription  Yes    Weight  4 lb    Reps  10-15      Interval Training   Interval Training  No      Treadmill   MPH  3.4    Grade  3    Minutes  15    METs  5.01      NuStep   Level  4    SPM  80    Minutes  15    METs  5.2      Home Exercise Plan   Plans to continue exercise at  Home (comment) walk and Bike    Frequency  Add 1 additional day to program exercise sessions.    Initial Home Exercises Provided  03/26/17       Functional Capacity: 6 Minute Walk    Row Name 02/23/17 1442 04/15/17 1746       6 Minute Walk   Phase  Initial  Discharge    Distance  1230 feet  1670 feet    Distance % Change  -  36 %    Distance Feet Change  -  440 ft    Walk Time  6 minutes  6 minutes    # of Rest Breaks  0  0    MPH  2.33  3.16    METS  4.01  4.52    RPE  13  11    VO2 Peak  14.03  15.83    Symptoms  No  No    Resting HR  64 bpm  79 bpm    Resting BP  126/74  118/62    Resting Oxygen Saturation   99 %  -    Exercise Oxygen Saturation  during 6 min walk  100 %  -    Max Ex. HR  97 bpm  84 bpm    Max Ex. BP  142/64  114/60    2 Minute Post BP  132/70  -       Quality of Life: Quality of Life - 02/23/17 1048      Quality of Life Scores   Health/Function Pre  20.4 %    Socioeconomic Pre  20.63 %    Psych/Spiritual Pre  24.64 %    Family Pre  8.8 %    GLOBAL Pre  19.64 %       Personal Goals: Goals established at orientation with interventions provided to work toward goal. Personal Goals and Risk Factors at Admission - 04/08/17 1640      Core Components/Risk Factors/Patient Goals on Admission    Weight Management  Yes        Personal Goals Discharge: Goals and Risk Factor Review - 04/08/17 1641       Core Components/Risk Factors/Patient Goals Review   Personal Goals Review  Lipids;Hypertension;Weight Management/Obesity    Review  weight 134lbs today. Her blood pressure has been good at 116/60.     Expected Outcomes  Jenna Boyer has been faithful to attend Cardiac Rehab.       Exercise Goals and Review: Exercise Goals    Row Name 02/23/17 1449             Exercise Goals   Increase Physical Activity  Yes       Intervention  Provide advice, education, support and counseling about physical activity/exercise needs.;Develop an individualized exercise prescription for aerobic and resistive training based on initial evaluation findings, risk stratification, comorbidities and participant's personal goals.       Expected Outcomes  Short Term: Attend rehab on a regular basis to increase amount of physical activity.;Long Term: Add in home exercise to make exercise part of routine and to increase amount of physical activity.;Long Term: Exercising regularly at least 3-5 days a week.       Increase Strength and Stamina  Yes       Intervention  Provide advice, education, support and counseling about physical activity/exercise needs.;Develop an individualized exercise prescription for aerobic and resistive training based on initial evaluation findings, risk stratification, comorbidities and participant's personal goals.       Expected Outcomes  Short Term: Increase workloads from initial exercise prescription for resistance, speed, and METs.;Short Term: Perform resistance training exercises routinely during  rehab and add in resistance training at home;Long Term: Improve cardiorespiratory fitness, muscular endurance and strength as measured by increased METs and functional capacity (6MWT)       Able to understand and use rate of perceived exertion (RPE) scale  Yes       Intervention  Provide education and explanation on how to use RPE scale       Expected Outcomes  Short Term: Able to use RPE daily in rehab  to express subjective intensity level;Long Term:  Able to use RPE to guide intensity level when exercising independently       Knowledge and understanding of Target Heart Rate Range (THRR)  Yes       Intervention  Provide education and explanation of THRR including how the numbers were predicted and where they are located for reference       Expected Outcomes  Short Term: Able to state/look up THRR;Long Term: Able to use THRR to govern intensity when exercising independently;Short Term: Able to use daily as guideline for intensity in rehab       Able to check pulse independently  Yes       Intervention  Provide education and demonstration on how to check pulse in carotid and radial arteries.;Review the importance of being able to check your own pulse for safety during independent exercise       Expected Outcomes  Short Term: Able to explain why pulse checking is important during independent exercise;Long Term: Able to check pulse independently and accurately       Understanding of Exercise Prescription  Yes       Intervention  Provide education, explanation, and written materials on patient's individual exercise prescription       Expected Outcomes  Short Term: Able to explain program exercise prescription;Long Term: Able to explain home exercise prescription to exercise independently          Nutrition & Weight - Outcomes: Pre Biometrics - 02/23/17 1449      Pre Biometrics   Height  5' 2.1" (1.577 m)    Weight  136 lb 1.6 oz (61.7 kg)    Waist Circumference  30 inches    Hip Circumference  37 inches    Waist to Hip Ratio  0.81 %    BMI (Calculated)  24.82    Single Leg Stand  24.57 seconds      Post Biometrics - 04/15/17 1745       Post  Biometrics   Height  5' 2.1" (1.577 m)    Weight  133 lb (60.3 kg)    Waist Circumference  28.5 inches    Hip Circumference  36.5 inches    Waist to Hip Ratio  0.78 %    BMI (Calculated)  24.26    Single Leg Stand  30 seconds        Nutrition: Nutrition Therapy & Goals - 03/19/17 1719      Nutrition Therapy   Diet  DASH    Protein (specify units)  8oz    Fiber  25 grams    Whole Grain Foods  3 servings    Saturated Fats  13 max. grams    Fruits and Vegetables  4 servings/day    Sodium  1500 grams      Personal Nutrition Goals   Nutrition Goal  Choose frozen meals with no more than 628m of sodium per package    Personal Goal #2  Look for dietary sources of Vitamin D due  to low lab value, such as egg yolks (2/wk) dark greens nuts and dairy products    Personal Goal #3  Combine sources of fiber protein and healthy fats at meals to increase satiety and to keep you full for a longer period of time    Personal Goal #4  Practice being a nutrition facts label reader and compare different products to identify sources high in sodium and fat    Comments  She reports feeling hungry throughout the day and having low energy d/t undereating. She has been unsure of what she can eat thus has decreased her energy intake      Intervention Plan   Intervention  Nutrition handout(s) given to patient.;Prescribe, educate and counsel regarding individualized specific dietary modifications aiming towards targeted core components such as weight, hypertension, lipid management, diabetes, heart failure and other comorbidities. Following a low sodium diet handout    Expected Outcomes  Short Term Goal: Understand basic principles of dietary content, such as calories, fat, sodium, cholesterol and nutrients.;Short Term Goal: A plan has been developed with personal nutrition goals set during dietitian appointment.;Long Term Goal: Adherence to prescribed nutrition plan.       Nutrition Discharge: Nutrition Assessments - 02/23/17 1052      MEDFICTS Scores   Pre Score  60       Education Questionnaire Score: Knowledge Questionnaire Score - 02/23/17 1051      Knowledge Questionnaire Score   Pre Score  19/28 correct answers reviewed with  Kaitlynn       Goals reviewed with patient; copy given to patient.

## 2017-04-20 NOTE — Progress Notes (Signed)
Discharge Progress Report  Patient Details  Name: Jenna Boyer MRN: [id number] Date of Birth: 04/07/1965 Referring Provider:     Cardiac Rehab from 02/23/2017 in [facility]  Referring Provider  Neoma Laming MD       Number of Visits: 36/36  Reason for Discharge:  Patient reached a stable level of exercise. Patient independent in their exercise. Patient has met program and personal goals.  Smoking History:  Social History   Tobacco Use  Smoking Status Never Smoker  Smokeless Tobacco Never Used    Diagnosis:  ST elevation myocardial infarction (STEMI), unspecified artery ([place])  Status post coronary artery stent placement  ADL [facility]:   Initial Exercise Prescription: Initial Exercise Prescription - 02/23/17 1400      Date of Initial Exercise RX and Referring Provider   Date  02/23/17    Referring Provider  Neoma Laming MD      Treadmill   MPH  2.3    Grade  3    Minutes  15    METs  3.71      NuStep   Level  4    SPM  80    Minutes  15    METs  3.7      Elliptical   Level  1    Speed  3.9    Minutes  15      Prescription Details   Frequency (times per week)  3    Duration  Progress to 45 minutes of aerobic exercise without signs/symptoms of physical distress      Intensity   THRR 40-80% of Max Heartrate  107-150    Ratings of Perceived Exertion  11-13    Perceived Dyspnea  0-4      Progression   Progression  Continue to progress workloads to maintain intensity without signs/symptoms of physical distress.      Resistance Training   Training Prescription  Yes    Weight  4 lbs    Reps  10-15       Discharge Exercise Prescription (Final Exercise Prescription Changes): Exercise Prescription Changes - 04/15/17 1500      Response to Exercise   Blood Pressure (Admit)  108/60    Blood Pressure (Exercise)  170/68    Blood Pressure (Exit)  98/56    Heart Rate (Admit)  75 bpm    Heart Rate (Exercise)  121 bpm    Heart Rate  (Exit)  72 bpm    Rating of Perceived Exertion (Exercise)  12    Symptoms  none    Duration  Continue with 45 min of aerobic exercise without signs/symptoms of physical distress.    Intensity  THRR unchanged      Progression   Progression  Continue to progress workloads to maintain intensity without signs/symptoms of physical distress.    Average METs  5.1      Resistance Training   Training Prescription  Yes    Weight  4 lb    Reps  10-15      Interval Training   Interval Training  No      Treadmill   MPH  3.4    Grade  3    Minutes  15    METs  5.01      NuStep   Level  4    SPM  80    Minutes  15    METs  5.2      Home Exercise Plan  Plans to continue exercise at  Home (comment) walk and Bike    Frequency  Add 1 additional day to program exercise sessions.    Initial Home Exercises Provided  03/26/17       Functional Capacity: 6 Minute Walk    Row Name 02/23/17 1442 04/15/17 1746       6 Minute Walk   Phase  Initial  Discharge    Distance  1230 feet  1670 feet    Distance % Change  -  36 %    Distance Feet Change  -  440 ft    Walk Time  6 minutes  6 minutes    # of Rest Breaks  0  0    MPH  2.33  3.16    METS  4.01  4.52    RPE  13  11    VO2 Peak  14.03  15.83    Symptoms  No  No    Resting HR  64 bpm  79 bpm    Resting BP  126/74  118/62    Resting Oxygen Saturation   99 %  -    Exercise Oxygen Saturation  during 6 min walk  100 %  -    Max Ex. HR  97 bpm  84 bpm    Max Ex. BP  142/64  114/60    2 Minute Post BP  132/70  -       Psychological, QOL, Others - Outcomes: PHQ 2/9: Depression screen [facility] 2/9 04/20/2017 02/23/2017  Decreased Interest 0 0  Down, Depressed, Hopeless 0 0  PHQ - 2 Score 0 0  Altered sleeping 0 0  Tired, decreased energy 0 1  Change in appetite 0 0  Feeling bad or failure about yourself  0 0  Trouble concentrating 0 0  Moving slowly or fidgety/restless 0 0  Suicidal thoughts 0 0  PHQ-9 Score 0 1  Difficult doing  work/chores - Not difficult at all    Quality of Life: Quality of Life - 04/20/17 1616      Quality of Life Scores   Health/Function Post  27.6 %    Socioeconomic Post  27.43 %    Psych/Spiritual Post  30 %    Family Post  21.83 %    GLOBAL Post  27.55 %       Personal Goals: Goals established at orientation with interventions provided to work toward goal. Personal Goals and Risk Factors at Admission - 04/08/17 1640      Core Components/Risk Factors/Patient Goals on Admission    Weight Management  Yes        Personal Goals Discharge: Goals and Risk Factor Review    Row Name 03/23/17 1620 04/08/17 1640 04/08/17 1641         Core Components/Risk Factors/Patient Goals Review   Personal Goals Review  Weight Management/Obesity;Lipids;Hypertension  Weight Management/Obesity  Lipids;Hypertension;Weight Management/Obesity     Review  Jenna Boyer is taking all her medications as prescribed, attending caridac rehab regularly,  and is managing her weight, blood pressure, and lipids.   -  weight 134lbs today. Her blood pressure has been good at 116/60.      Expected Outcomes  Short: Jenna Boyer will continue regular attendance to cardiac rehab. Long: Jenna Boyer will adopt healthy exercise and nutrician habits that will continue to aid in risk factor modification.    -  Jenna Boyer has been faithful to attend Cardiac Rehab.        Exercise  Goals and Review: Exercise Goals    Row Name 02/23/17 1449             Exercise Goals   Increase Physical Activity  Yes       Intervention  Provide advice, education, support and counseling about physical activity/exercise needs.;Develop an individualized exercise prescription for aerobic and resistive training based on initial evaluation findings, risk stratification, comorbidities and participant's personal goals.       Expected Outcomes  Short Term: Attend rehab on a regular basis to increase amount of physical activity.;Long Term: Add in home exercise to make  exercise part of routine and to increase amount of physical activity.;Long Term: Exercising regularly at least 3-5 days a week.       Increase Strength and Stamina  Yes       Intervention  Provide advice, education, support and counseling about physical activity/exercise needs.;Develop an individualized exercise prescription for aerobic and resistive training based on initial evaluation findings, risk stratification, comorbidities and participant's personal goals.       Expected Outcomes  Short Term: Increase workloads from initial exercise prescription for resistance, speed, and METs.;Short Term: Perform resistance training exercises routinely during rehab and add in resistance training at home;Long Term: Improve cardiorespiratory fitness, muscular endurance and strength as measured by increased METs and functional capacity (6MWT)       Able to understand and use rate of perceived exertion (RPE) scale  Yes       Intervention  Provide education and explanation on how to use RPE scale       Expected Outcomes  Short Term: Able to use RPE daily in rehab to express subjective intensity level;Long Term:  Able to use RPE to guide intensity level when exercising independently       Knowledge and understanding of Target Heart Rate Range (THRR)  Yes       Intervention  Provide education and explanation of THRR including how the numbers were predicted and where they are located for reference       Expected Outcomes  Short Term: Able to state/look up THRR;Long Term: Able to use THRR to govern intensity when exercising independently;Short Term: Able to use daily as guideline for intensity in rehab       Able to check pulse independently  Yes       Intervention  Provide education and demonstration on how to check pulse in carotid and radial arteries.;Review the importance of being able to check your own pulse for safety during independent exercise       Expected Outcomes  Short Term: Able to explain why pulse  checking is important during independent exercise;Long Term: Able to check pulse independently and accurately       Understanding of Exercise Prescription  Yes       Intervention  Provide education, explanation, and written materials on patient's individual exercise prescription       Expected Outcomes  Short Term: Able to explain program exercise prescription;Long Term: Able to explain home exercise prescription to exercise independently          Nutrition & Weight - Outcomes: Pre Biometrics - 02/23/17 1449      Pre Biometrics   Height  5' 2.1" (1.577 m)    Weight  136 lb 1.6 oz (61.7 kg)    Waist Circumference  30 inches    Hip Circumference  37 inches    Waist to Hip Ratio  0.81 %    BMI (Calculated)  24.82  Single Leg Stand  24.57 seconds      Post Biometrics - 04/15/17 1745       Post  Biometrics   Height  5' 2.1" (1.577 m)    Weight  133 lb (60.3 kg)    Waist Circumference  28.5 inches    Hip Circumference  36.5 inches    Waist to Hip Ratio  0.78 %    BMI (Calculated)  24.26    Single Leg Stand  30 seconds       Nutrition: Nutrition Therapy & Goals - 03/19/17 1719      Nutrition Therapy   Diet  DASH    Protein (specify units)  8oz    Fiber  25 grams    Whole Grain Foods  3 servings    Saturated Fats  13 max. grams    Fruits and Vegetables  4 servings/day    Sodium  1500 grams      Personal Nutrition Goals   Nutrition Goal  Choose frozen meals with no more than 646m of sodium per package    Personal Goal #2  Look for dietary sources of Vitamin D due to low lab value, such as egg yolks (2/wk) dark greens nuts and dairy products    Personal Goal #3  Combine sources of fiber protein and healthy fats at meals to increase satiety and to keep you full for a longer period of time    Personal Goal #4  Practice being a nutrition facts label reader and compare different products to identify sources high in sodium and fat    Comments  She reports feeling hungry  throughout the day and having low energy d/t undereating. She has been unsure of what she can eat thus has decreased her energy intake      Intervention Plan   Intervention  Nutrition handout(s) given to patient.;Prescribe, educate and counsel regarding individualized specific dietary modifications aiming towards targeted core components such as weight, hypertension, lipid management, diabetes, heart failure and other comorbidities. Following a low sodium diet handout    Expected Outcomes  Short Term Goal: Understand basic principles of dietary content, such as calories, fat, sodium, cholesterol and nutrients.;Short Term Goal: A plan has been developed with personal nutrition goals set during dietitian appointment.;Long Term Goal: Adherence to prescribed nutrition plan.       Nutrition Discharge: Nutrition Assessments - 04/20/17 1616      MEDFICTS Scores   Post Score  18       Education Questionnaire Score: Knowledge Questionnaire Score - 04/20/17 1617      Knowledge Questionnaire Score   Post Score  25/28       Goals reviewed with patient; copy given to patient.

## 2017-04-20 NOTE — Progress Notes (Signed)
Cardiac Individual Treatment Plan  Patient Details  Name: Jenna Boyer MRN: [id number] Date of Birth: 1965/12/02 Referring Provider:     Cardiac Rehab from 02/23/2017 in [facility] Cardiac and Pulmonary Rehab  Referring Provider  Neoma Laming MD      Initial Encounter Date:    Cardiac Rehab from 02/23/2017 in [facility] Cardiac and Pulmonary Rehab  Date  02/23/17  Referring Provider  Neoma Laming MD      Visit Diagnosis: ST elevation myocardial infarction (STEMI), unspecified artery [facility])  Status post coronary artery stent placement  Patient's Home Medications on Admission:  Current Outpatient Medications:  .  aspirin EC 81 MG tablet, Take 81 mg by mouth daily., Disp: , Rfl:  .  digoxin (LANOXIN) 0.25 MG tablet, Take 1 tablet (0.25 mg total) by mouth daily., Disp: 30 tablet, Rfl: 0 .  fexofenadine (ALLEGRA) 180 MG tablet, Take 180 mg by mouth daily., Disp: , Rfl:  .  metoprolol succinate (TOPROL-XL) 50 MG 24 hr tablet, TAKE 1 TABLET BY MOUTH EVERY DAY--NEW DIRECTIONS, Disp: , Rfl: 1 .  metoprolol tartrate (LOPRESSOR) 25 MG tablet, Take 0.5 tablets (12.5 mg total) by mouth 2 (two) times daily. Hold if sbp<100 or heart rate less than 50 (Patient not taking: Reported on 02/23/2017), Disp: 30 tablet, Rfl: 11 .  metoprolol tartrate (LOPRESSOR) 25 MG tablet, Take 0.5 tablets (12.5 mg total) by mouth 2 (two) times daily. (Patient not taking: Reported on 02/23/2017), Disp: 30 tablet, Rfl: 0 .  midodrine (PROAMATINE) 10 MG tablet, Take 1 tablet (10 mg total) by mouth 3 (three) times daily with meals. (Patient not taking: Reported on 02/23/2017), Disp: 90 tablet, Rfl: 0 .  midodrine (PROAMATINE) 5 MG tablet, TAKE 1 TABLET BY MOUTH ONCE DAILY. NOTE DOSE DECREASE., Disp: , Rfl: 1 .  nitroGLYCERIN (NITROSTAT) 0.4 MG SL tablet, Place 1 tablet (0.4 mg total) under the tongue every 5 (five) minutes as needed for chest pain., Disp: 10 tablet, Rfl: 12 .  rosuvastatin (CRESTOR) 40 MG tablet, Take 1 tablet (40 mg  total) by mouth every evening., Disp: 30 tablet, Rfl: 0 .  ticagrelor (BRILINTA) 90 MG TABS tablet, Take 1 tablet (90 mg total) by mouth 2 (two) times daily., Disp: 60 tablet, Rfl: 2  Past Medical History: Past Medical History:  Diagnosis Date  . Hyperlipemia   . Hypertension     Tobacco Use: Social History   Tobacco Use  Smoking Status Never Smoker  Smokeless Tobacco Never Used    Labs: Recent Review Flowsheet Data    Labs for ITP Cardiac and Pulmonary Rehab Latest Ref Rng & Units 01/25/2017 01/27/2017   Cholestrol 0 - 200 mg/dL - 151   LDLCALC 0 - 99 mg/dL - 83   HDL >40 mg/dL - 50   Trlycerides <150 mg/dL - 89   Hemoglobin A1c 4.8 - 5.6 % 6.2(H) -       Exercise Target Goals:    Exercise Program Goal: Individual exercise prescription set using results from initial 6 min walk test and THRR while considering  patient's activity barriers and safety.   Exercise Prescription Goal: Initial exercise prescription builds to 30-45 minutes a day of aerobic activity, 2-3 days per week.  Home exercise guidelines will be given to patient during program as part of exercise prescription that the participant will acknowledge.  Activity Barriers & Risk Stratification: Activity Barriers & Cardiac Risk Stratification - 02/23/17 1316      Activity Barriers & Cardiac Risk Stratification   Activity Barriers  None    Cardiac Risk Stratification  Moderate       6 Minute Walk: 6 Minute Walk    Row Name 02/23/17 1442 04/15/17 1746       6 Minute Walk   Phase  Initial  Discharge    Distance  1230 feet  1670 feet    Distance % Change  -  36 %    Distance Feet Change  -  440 ft    Walk Time  6 minutes  6 minutes    # of Rest Breaks  0  0    MPH  2.33  3.16    METS  4.01  4.52    RPE  13  11    VO2 Peak  14.03  15.83    Symptoms  No  No    Resting HR  64 bpm  79 bpm    Resting BP  126/74  118/62    Resting Oxygen Saturation   99 %  -    Exercise Oxygen Saturation  during 6 min  walk  100 %  -    Max Ex. HR  97 bpm  84 bpm    Max Ex. BP  142/64  114/60    2 Minute Post BP  132/70  -       Oxygen Initial Assessment:   Oxygen Re-Evaluation:   Oxygen Discharge (Final Oxygen Re-Evaluation):   Initial Exercise Prescription: Initial Exercise Prescription - 02/23/17 1400      Date of Initial Exercise RX and Referring Provider   Date  02/23/17    Referring Provider  Neoma Laming MD      Treadmill   MPH  2.3    Grade  3    Minutes  15    METs  3.71      NuStep   Level  4    SPM  80    Minutes  15    METs  3.7      Elliptical   Level  1    Speed  3.9    Minutes  15      Prescription Details   Frequency (times per week)  3    Duration  Progress to 45 minutes of aerobic exercise without signs/symptoms of physical distress      Intensity   THRR 40-80% of Max Heartrate  107-150    Ratings of Perceived Exertion  11-13    Perceived Dyspnea  0-4      Progression   Progression  Continue to progress workloads to maintain intensity without signs/symptoms of physical distress.      Resistance Training   Training Prescription  Yes    Weight  4 lbs    Reps  10-15       Perform Capillary Blood Glucose checks as needed.  Exercise Prescription Changes: Exercise Prescription Changes    Row Name 02/23/17 1400 03/04/17 1200 03/17/17 1100 03/26/17 1700 04/01/17 1300     Response to Exercise   Blood Pressure (Admit)  126/74  112/80  128/64  -  118/62   Blood Pressure (Exercise)  142/64  124/62  -  -  138/68   Blood Pressure (Exit)  132/70  104/52  94/52  -  98/54   Heart Rate (Admit)  64 bpm  72 bpm  67 bpm  -  70 bpm   Heart Rate (Exercise)  97 bpm  130 bpm  130 bpm  -  133 bpm  Heart Rate (Exit)  66 bpm  83 bpm  82 bpm  -  69 bpm   Oxygen Saturation (Admit)  99 %  -  -  -  -   Oxygen Saturation (Exercise)  100 %  -  -  -  -   Rating of Perceived Exertion (Exercise)  '13  13  13  '$ -  14   Symptoms  none  none  none  -  none   Comments  walk test  results  -  -  -  -     Progression   Progression  -  Continue to progress workloads to maintain intensity without signs/symptoms of physical distress.  Continue to progress workloads to maintain intensity without signs/symptoms of physical distress.  -  Continue to progress workloads to maintain intensity without signs/symptoms of physical distress.   Average METs  -  2.85  3.71  -  4.3     Resistance Training   Training Prescription  -  Yes  Yes  -  Yes   Weight  -  4 lb  4 lb  -  4 lb   Reps  -  10-15  10-15  -  10-15     Interval Training   Interval Training  -  -  No  -  No     Treadmill   MPH  -  2.3  2.3  -  2.8   Grade  -  3  3  -  3   Minutes  -  15  15  -  15   METs  -  3.71  3.71  -  4.3     NuStep   Level  -  2  -  -  4   SPM  -  80  -  -  80   Minutes  -  15  -  -  15   METs  -  2  -  -  -     Elliptical   Level  -  -  1  -  -   Speed  -  -  3  -  -   Minutes  -  -  15  -  -     Home Exercise Plan   Plans to continue exercise at  -  -  -  Home (comment) walk and Bike  Home (comment) walk and Bike   Frequency  -  -  -  Add 1 additional day to program exercise sessions.  Add 1 additional day to program exercise sessions.   Initial Home Exercises Provided  -  -  -  03/26/17  03/26/17   Row Name 04/15/17 1500             Response to Exercise   Blood Pressure (Admit)  108/60       Blood Pressure (Exercise)  170/68       Blood Pressure (Exit)  98/56       Heart Rate (Admit)  75 bpm       Heart Rate (Exercise)  121 bpm       Heart Rate (Exit)  72 bpm       Rating of Perceived Exertion (Exercise)  12       Symptoms  none       Duration  Continue with 45 min of aerobic exercise without signs/symptoms of physical distress.       Intensity  THRR unchanged  Progression   Progression  Continue to progress workloads to maintain intensity without signs/symptoms of physical distress.       Average METs  5.1         Resistance Training   Training  Prescription  Yes       Weight  4 lb       Reps  10-15         Interval Training   Interval Training  No         Treadmill   MPH  3.4       Grade  3       Minutes  15       METs  5.01         NuStep   Level  4       SPM  80       Minutes  15       METs  5.2         Home Exercise Plan   Plans to continue exercise at  Home (comment) walk and Bike       Frequency  Add 1 additional day to program exercise sessions.       Initial Home Exercises Provided  03/26/17          Exercise Comments: Exercise Comments    Row Name 03/02/17 1631           Exercise Comments  First full day of exercise!  Patient was oriented to gym and equipment including functions, settings, policies, and procedures.  Patient's individual exercise prescription and treatment plan were reviewed.  All starting workloads were established based on the results of the 6 minute walk test done at initial orientation visit.  The plan for exercise progression was also introduced and progression will be customized based on patient's performance and goals.          Exercise Goals and Review: Exercise Goals    Row Name 02/23/17 1449             Exercise Goals   Increase Physical Activity  Yes       Intervention  Provide advice, education, support and counseling about physical activity/exercise needs.;Develop an individualized exercise prescription for aerobic and resistive training based on initial evaluation findings, risk stratification, comorbidities and participant's personal goals.       Expected Outcomes  Short Term: Attend rehab on a regular basis to increase amount of physical activity.;Long Term: Add in home exercise to make exercise part of routine and to increase amount of physical activity.;Long Term: Exercising regularly at least 3-5 days a week.       Increase Strength and Stamina  Yes       Intervention  Provide advice, education, support and counseling about physical activity/exercise needs.;Develop  an individualized exercise prescription for aerobic and resistive training based on initial evaluation findings, risk stratification, comorbidities and participant's personal goals.       Expected Outcomes  Short Term: Increase workloads from initial exercise prescription for resistance, speed, and METs.;Short Term: Perform resistance training exercises routinely during rehab and add in resistance training at home;Long Term: Improve cardiorespiratory fitness, muscular endurance and strength as measured by increased METs and functional capacity (6MWT)       Able to understand and use rate of perceived exertion (RPE) scale  Yes       Intervention  Provide education and explanation on how to use RPE scale       Expected Outcomes  Short Term: Able to use RPE daily in rehab to express subjective intensity level;Long Term:  Able to use RPE to guide intensity level when exercising independently       Knowledge and understanding of Target Heart Rate Range (THRR)  Yes       Intervention  Provide education and explanation of THRR including how the numbers were predicted and where they are located for reference       Expected Outcomes  Short Term: Able to state/look up THRR;Long Term: Able to use THRR to govern intensity when exercising independently;Short Term: Able to use daily as guideline for intensity in rehab       Able to check pulse independently  Yes       Intervention  Provide education and demonstration on how to check pulse in carotid and radial arteries.;Review the importance of being able to check your own pulse for safety during independent exercise       Expected Outcomes  Short Term: Able to explain why pulse checking is important during independent exercise;Long Term: Able to check pulse independently and accurately       Understanding of Exercise Prescription  Yes       Intervention  Provide education, explanation, and written materials on patient's individual exercise prescription        Expected Outcomes  Short Term: Able to explain program exercise prescription;Long Term: Able to explain home exercise prescription to exercise independently          Exercise Goals Re-Evaluation : Exercise Goals Re-Evaluation    Row Name 03/02/17 1631 03/17/17 1138 03/23/17 1646 03/26/17 1718 04/01/17 1340     Exercise Goal Re-Evaluation   Exercise Goals Review  Increase Strength and Stamina;Increase Physical Activity;Able to understand and use rate of perceived exertion (RPE) scale;Knowledge and understanding of Target Heart Rate Range (THRR)  Increase Physical Activity;Increase Strength and Stamina;Able to understand and use rate of perceived exertion (RPE) scale  Increase Physical Activity;Increase Strength and Stamina  Increase Physical Activity;Increase Strength and Stamina  Increase Physical Activity;Increase Strength and Stamina;Able to understand and use rate of perceived exertion (RPE) scale;Knowledge and understanding of Target Heart Rate Range (THRR)   Comments  Reviewed RPE scale, THR and program prescription with pt today.  Pt voiced understanding and was given a copy of goals to take home.   Kyndal is progressing well with exercise.  The ellipitcal is challenging but she continues to tolerate it well.  Allena stated that she has been feeling good and notices a differenece in her strength since starting the program.   Reviewed home exercise with pt today.  Pt plans to use a bike and walk at home for exercise.  Reviewed THR, pulse, RPE, sign and symptoms, NTG use, and when to call 911 or MD.  Also discussed weather considerations and indoor options.  Pt voiced understanding.  Maybel is reaching THR and RPE goals.  Staff will monitor progress.   Expected Outcomes  Short: Use RPE daily to regulate intensity.  Long: Follow program prescription in THR.  Short - Cigi will continue to attend class Long - Iqra will improve MET level  Short - Antonietta will continue to attend class Long - Vonne will  improve MET level  Short: add one extra day of walking and Biking at home. Long: maintain and increase home exercise.  Short - Eulla will continue to attend regularly [place] will improve overall MET level   Row Name 04/15/17 [phone]  Exercise Goal Re-Evaluation   Exercise Goals Review  Increase Physical Activity;Able to understand and use rate of perceived exertion (RPE) scale;Increase Strength and Stamina;Able to understand and use Dyspnea scale;Knowledge and understanding of Target Heart Rate Range (THRR)       Comments  Izora continues to progress well.  Her MET level is up to 5.1!  Staff will monitor progress.       Expected Outcomes  Short - Kamia will continue to build fitness level [place] will maintain fitness on her own.           Discharge Exercise Prescription (Final Exercise Prescription Changes): Exercise Prescription Changes - 04/15/17 1500      Response to Exercise   Blood Pressure (Admit)  108/60    Blood Pressure (Exercise)  170/68    Blood Pressure (Exit)  98/56    Heart Rate (Admit)  75 bpm    Heart Rate (Exercise)  121 bpm    Heart Rate (Exit)  72 bpm    Rating of Perceived Exertion (Exercise)  12    Symptoms  none    Duration  Continue with 45 min of aerobic exercise without signs/symptoms of physical distress.    Intensity  THRR unchanged      Progression   Progression  Continue to progress workloads to maintain intensity without signs/symptoms of physical distress.    Average METs  5.1      Resistance Training   Training Prescription  Yes    Weight  4 lb    Reps  10-15      Interval Training   Interval Training  No      Treadmill   MPH  3.4    Grade  3    Minutes  15    METs  5.01      NuStep   Level  4    SPM  80    Minutes  15    METs  5.2      Home Exercise Plan   Plans to continue exercise at  Home (comment) walk and Bike    Frequency  Add 1 additional day to program exercise sessions.    Initial Home Exercises  Provided  03/26/17       Nutrition:  Target Goals: Understanding of nutrition guidelines, daily intake of sodium '1500mg'$ , cholesterol '200mg'$ , calories 30% from fat and 7% or less from saturated fats, daily to have 5 or more servings of fruits and vegetables.  Biometrics: Pre Biometrics - 02/23/17 1449      Pre Biometrics   Height  5' 2.1" (1.577 m)    Weight  136 lb 1.6 oz (61.7 kg)    Waist Circumference  30 inches    Hip Circumference  37 inches    Waist to Hip Ratio  0.81 %    BMI (Calculated)  24.82    Single Leg Stand  24.57 seconds      Post Biometrics - 04/15/17 1745       Post  Biometrics   Height  5' 2.1" (1.577 m)    Weight  133 lb (60.3 kg)    Waist Circumference  28.5 inches    Hip Circumference  36.5 inches    Waist to Hip Ratio  0.78 %    BMI (Calculated)  24.26    Single Leg Stand  30 seconds       Nutrition Therapy Plan and Nutrition Goals: Nutrition Therapy & Goals - 03/19/17 1719  Nutrition Therapy   Diet  DASH    Protein (specify units)  8oz    Fiber  25 grams    Whole Grain Foods  3 servings    Saturated Fats  13 max. grams    Fruits and Vegetables  4 servings/day    Sodium  1500 grams      Personal Nutrition Goals   Nutrition Goal  Choose frozen meals with no more than '600mg'$  of sodium per package    Personal Goal #2  Look for dietary sources of Vitamin D due to low lab value, such as egg yolks (2/wk) dark greens nuts and dairy products    Personal Goal #3  Combine sources of fiber protein and healthy fats at meals to increase satiety and to keep you full for a longer period of time    Personal Goal #4  Practice being a nutrition facts label reader and compare different products to identify sources high in sodium and fat    Comments  She reports feeling hungry throughout the day and having low energy d/t undereating. She has been unsure of what she can eat thus has decreased her energy intake      Intervention Plan   Intervention  Nutrition  handout(s) given to patient.;Prescribe, educate and counsel regarding individualized specific dietary modifications aiming towards targeted core components such as weight, hypertension, lipid management, diabetes, heart failure and other comorbidities. Following a low sodium diet handout    Expected Outcomes  Short Term Goal: Understand basic principles of dietary content, such as calories, fat, sodium, cholesterol and nutrients.;Short Term Goal: A plan has been developed with personal nutrition goals set during dietitian appointment.;Long Term Goal: Adherence to prescribed nutrition plan.       Nutrition Assessments: Nutrition Assessments - 04/20/17 1616      MEDFICTS Scores   Post Score  18       Nutrition Goals Re-Evaluation: Nutrition Goals Re-Evaluation    Row Name 03/12/17 1704 04/08/17 1636           Goals   Current Weight  133 lb 6.4 oz (60.5 kg)  134 lb (60.8 kg)      Nutrition Goal  Meet with the dietician.  -      Comment  Bridgid wants to have a more structured diet and is scared of what she can eat and not eat. She is looking forward to meeting the dietician on 03/19/17  Revonda said meeting with the dietician was very helpful and she was shown label details and is eating heart healthier and lower sodium.       Expected Outcome  Short: meet with the dietician. Long: adhere to a diet plan  Met with the CR REgistered Dieitician and was glad to and is now following a more heart healthy diet.         Personal Goal #2 Re-Evaluation   Personal Goal #2  -  Taking Vit D per her MD also        Personal Goal #3 Re-Evaluation   Personal Goal #3  -  Hard to go out to eat with her friends and will look for heart         Personal Goal #4 Re-Evaluation   Personal Goal #4  -  Reading labels better         Nutrition Goals Discharge (Final Nutrition Goals Re-Evaluation): Nutrition Goals Re-Evaluation - 04/08/17 1636      Goals   Current Weight  134 lb (60.8  kg)    Comment  Shirly  said meeting with the dietician was very helpful and she was shown label details and is eating heart healthier and lower sodium.     Expected Outcome  Met with the CR REgistered Dieitician and was glad to and is now following a more heart healthy diet.       Personal Goal #2 Re-Evaluation   Personal Goal #2  Taking Vit D per her MD also      Personal Goal #3 Re-Evaluation   Personal Goal #3  Hard to go out to eat with her friends and will look for heart       Personal Goal #4 Re-Evaluation   Personal Goal #4  Reading labels better       Psychosocial: Target Goals: Acknowledge presence or absence of significant depression and/or stress, maximize coping skills, provide positive support system. Participant is able to verbalize types and ability to use techniques and skills needed for reducing stress and depression.   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 She has been caring for her mom for 7 years and started right after her own husband passed away. Her mother's health is steadily declining and it is hard to focus on her own health after this STEMI.        Family Dynamics   Good Support System?  -- friends and pastor      Screening Interventions   Interventions  Encouraged to exercise;Program counselor consult    Expected Outcomes  Short Term goal: Utilizing psychosocial counselor, staff and physician to assist with identification of specific Stressors or current issues interfering with healing process. Setting desired goal for each stressor or current issue identified.;Long Term Goal: Stressors or current issues are controlled or eliminated.;Short Term goal: Identification and review with participant of any Quality of Life or Depression concerns found  by scoring the questionnaire.;Long Term goal: The participant improves quality of Life and PHQ9 Scores as seen by post scores and/or verbalization of changes       Quality of Life Scores:  Quality of Life - 04/20/17 1616      Quality of Life Scores   Health/Function Post  27.6 %    Socioeconomic Post  27.43 %    Psych/Spiritual Post  30 %    Family Post  21.83 %    GLOBAL Post  27.55 %      Scores of 19 and below usually indicate a poorer quality of life in these areas.  A difference of  2-3 points is a clinically meaningful difference.  A difference of 2-3 points in the total score of the Quality of Life Index has been associated with significant improvement in overall quality of life, self-image, physical symptoms, and general health in studies assessing change in quality of life.  PHQ-9: Recent Review Flowsheet Data    Depression screen [facility] 2/9 04/20/2017 02/23/2017   Decreased Interest 0 0   Down, Depressed, Hopeless 0 0   PHQ - 2 Score 0 0   Altered sleeping 0 0   Tired, decreased energy 0 1   Change in appetite 0 0   Feeling bad or failure about yourself  0 0   Trouble concentrating  0 0   Moving slowly or fidgety/restless 0 0   Suicidal thoughts 0 0   PHQ-9 Score 0 1   Difficult doing work/chores - Not difficult at all     Interpretation of Total Score  Total Score Depression Severity:  1-4 = Minimal depression, 5-9 = Mild depression, 10-14 = Moderate depression, 15-19 = Moderately severe depression, 20-27 = Severe depression   Psychosocial Evaluation and Intervention: Psychosocial Evaluation - 03/02/17 1716      Psychosocial Evaluation & Interventions   Interventions  Encouraged to exercise with the program and follow exercise prescription;Stress management education    Comments  Counselor met with Ms. Eden Lathe today Sherril Croon) for initial psychosocial evaluation.  She is a 52 year old who had a heart attack and stent on 01/25/17.  Dilcia has a strong support system with  her local church community.  Her mother has severe dementia and Shweta was caring for her until this heart attack occurred and her mother has since been moved.  Kina has a good appetite and reports sleeping well most of the time.  She denies a history of depression or anxiety or any current symptoms.  She states her health and being able to get back to work are her primary stressors currently - now that her mother has moved.  Sena states she is typically in a positive mood.  She has goals to get stronger and healthier overall and is looking forward to the education pieces of this program as well.  She will be followed by staff.      Expected Outcomes  Symiah will benefit from consistent exercise to achieve her stated goals.  The educational and psychoeducational components will help her understand and cope more positively with her health condition.  Anel will meet with the dietician to learn more about ways to eat healthier.      Continue Psychosocial Services   Follow up required by staff       Psychosocial Re-Evaluation: Psychosocial Re-Evaluation    [place] Name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

## 2017-04-20 NOTE — Progress Notes (Signed)
Daily Session Note  Patient Details  Name: Jenna Boyer MRN: [id number] Date of Birth: 19-Oct-1965 Referring Provider:     Cardiac Rehab from 02/23/2017 in [facility] Cardiac and Pulmonary Rehab  Referring Provider  Jenna Laming MD      Encounter Date: 04/20/2017  Check In: Session Check In - 04/20/17 1757      Check-In   Location  [facility]-Cardiac & Pulmonary Rehab    Staff Present  Jenna Boyer, BA, ACSM CEP, Exercise Physiologist;Jenna Boyer Amedeo Plenty, BS, ACSM CEP, Exercise Physiologist;Jenna Sherryll Burger, RN BSN;Jenna Enterkin, RN, BSN    Supervising physician immediately available to respond to emergencies  See telemetry face sheet for immediately available ER MD    Medication changes reported      No    Fall or balance concerns reported     No    Warm-up and Cool-down  Performed on first and last piece of equipment    Resistance Training Performed  Yes    VAD Patient?  No      Pain Assessment   Currently in Pain?  No/denies    Multiple Pain Sites  No          Social History   Tobacco Use  Smoking Status Never Smoker  Smokeless Tobacco Never Used    Goals Met:  Independence with exercise equipment Exercise tolerated well No report of cardiac concerns or symptoms Strength training completed today  Goals Unmet:  Not Applicable  Comments:  Jenna Boyer graduated today from  rehab with 36 sessions completed.  Details of the patient's exercise prescription and what She needs to do in order to continue the prescription and progress were discussed with patient.  Patient was given a copy of prescription and goals.  Patient verbalized understanding.  Jenna Boyer plans to continue to exercise by joining [employer].    Dr. Emily Boyer is Medical Director for [place] and [facility].

## 2017-09-09 ENCOUNTER — Other Ambulatory Visit: Payer: Self-pay | Admitting: Internal Medicine

## 2017-09-09 DIAGNOSIS — Z1231 Encounter for screening mammogram for malignant neoplasm of breast: Secondary | ICD-10-CM

## 2017-10-05 ENCOUNTER — Encounter: Payer: BLUE CROSS/BLUE SHIELD | Attending: Internal Medicine | Admitting: Dietician

## 2017-10-05 ENCOUNTER — Encounter: Payer: Self-pay | Admitting: Dietician

## 2017-10-05 VITALS — BP 120/68 | Ht 61.0 in | Wt 128.6 lb

## 2017-10-05 DIAGNOSIS — Z713 Dietary counseling and surveillance: Secondary | ICD-10-CM | POA: Insufficient documentation

## 2017-10-05 DIAGNOSIS — E119 Type 2 diabetes mellitus without complications: Secondary | ICD-10-CM | POA: Diagnosis not present

## 2017-10-05 NOTE — Patient Instructions (Addendum)
  Check blood sugars 2 x day before breakfast and 2 hrs after supper every day  Bring blood sugar records to the next appointment/class  Exercise:  Ride exercise bike  for    30 minutes   3  days a week  Eat 3 meals day and    1  snack a day at bedtime  Space meals 4-5 hours apart  Avoid sugar sweetened drinks (soda, tea, coffee, sports drinks, juices)  Limit intake  fried foods and sweets  Eat 2-3 carbohydrate servings/meal + protein  Eat 1 carbohydrate serving/snack + protein  Get a Sharps container  Carry fast acting glucose and a snack at all times  Return for appointment/classes on:  11-02-17

## 2017-10-05 NOTE — Progress Notes (Signed)
Diabetes Self-Management Education  Visit Type: First/Initial  Appt. Start Time: 1545 Appt. End Time: 1700  10/05/2017  Ms. [name], identified by name and date of birth, is a 52 y.o. female with a diagnosis of Diabetes: Type 2.   ASSESSMENT  Blood pressure 120/68, height 5\' 1"  (1.549 m), weight 128 lb 9.6 oz (58.3 kg). Body mass index is 24.3 kg/m.  Diabetes Self-Management Education - 10/05/17 2015      Visit Information   Visit Type  First/Initial      Initial Visit   Diabetes Type  Type 2      Health Coping   How would you rate your overall health?  Good      Psychosocial Assessment   Patient Belief/Attitude about Diabetes  Motivated to manage diabetes    Self-care barriers  None    Self-management support  Doctor's office    Other persons present  Patient    Patient Concerns  Medication;Glycemic Control;Healthy Lifestyle   become more fit, prevent complications   Special Needs  None    Preferred Learning Style  Hands on;Auditory    Learning Readiness  Ready    What is the last grade level you completed in school?  BS       Pre-Education Assessment   Patient understands the diabetes disease and treatment process.  Needs Instruction    Patient understands incorporating nutritional management into lifestyle.  Needs Instruction    Patient undertands incorporating physical activity into lifestyle.  Needs Instruction    Patient understands using medications safely.  Needs Instruction    Patient understands monitoring blood glucose, interpreting and using results  Needs Instruction   pt has Contour Next meter but does not know how to use   Patient understands prevention, detection, and treatment of acute complications.  Needs Instruction    Patient understands prevention, detection, and treatment of chronic complications.  Needs Instruction    Patient understands how to develop strategies to address psychosocial issues.  Needs Instruction    Patient understands how  to develop strategies to promote health/change behavior.  Needs Instruction      Complications   Last HgB A1C per patient/outside source  9.1 %   09-22-17   How often do you check your blood sugar?  0 times/day (not testing)    Have you had a dilated eye exam in the past 12 months?  No   08-2016; appt 10-06-17   Have you had a dental exam in the past 12 months?  No   appt 10-08-17   Are you checking your feet?  No      Dietary Intake   Breakfast  eats breakfast at 10a    Snack (morning)  none    Lunch  usually skips lunch    Snack (afternoon)  none    Dinner  eats supper at 5:30p    Snack (evening)  eats popcorn at 8p-9p    Beverage(s)  drinks water 6-7x/day, occasional fruit juice and occasional lactose free milk      Exercise   Exercise Type  ADL's      Patient Education   Previous Diabetes Education  No    Disease state   Definition of diabetes, type 1 and 2, and the diagnosis of diabetes;Factors that contribute to the development of diabetes    Nutrition management   Role of diet in the treatment of diabetes and the relationship between the three main macronutrients and blood glucose level;Carbohydrate counting;Food label reading,  portion sizes and measuring food.    Physical activity and exercise   Role of exercise on diabetes management, blood pressure control and cardiac health.;Helped patient identify appropriate exercises in relation to his/her diabetes, diabetes complications and other health issue.    Medications  Reviewed patients medication for diabetes, action, purpose, timing of dose and side effects.    Monitoring  Taught/evaluated SMBG meter (Contour Next meter)-BG 164 (2 hr pp).;Identified appropriate SMBG and/or A1C goals.;Yearly dilated eye exam;Taught/discussed recording of test results and interpretation of SMBG.    Acute complications  Taught treatment of hypoglycemia - the 15 rule.   provided 1 tube of glucose tablets for PRN use   Chronic complications   Relationship between chronic complications and blood glucose control;Retinopathy and reason for yearly dilated eye exams;Reviewed with patient heart disease, higher risk of, and prevention;Dental care;Lipid levels, blood glucose control and heart disease;Nephropathy, what it is, prevention of, the use of ACE, ARB's and early detection of through urine microalbumia.    Personal strategies to promote health  Lifestyle issues that need to be addressed for better diabetes care;Helped patient develop diabetes management plan for (enter comment)      Outcomes   Expected Outcomes  Demonstrated interest in learning. Expect positive outcomes       Individualized Plan for Diabetes Self-Management Training:   Learning Objective:  Patient will have a greater understanding of diabetes self-management. Patient education plan is to attend individual and/or group sessions per assessed needs and concerns.   Plan:   Patient Instructions   Check blood sugars 2 x day before breakfast and 2 hrs after supper every day  Bring blood sugar records to the next appointment/class  Exercise:  Ride exercise bike  for    30 minutes   3  days a week  Eat 3 meals day and    1  snack a day at bedtime  Space meals 4-5 hours apart  Avoid sugar sweetened drinks (soda, tea, coffee, sports drinks, juices)  Limit intake  fried foods and sweets  Eat 2-3 carbohydrate servings/meal + protein  Eat 1 carbohydrate serving/snack + protein  Get a Haematologist fast acting glucose and a snack at all times  Return for appointment/classes on:  11-02-17   Expected Outcomes:  Demonstrated interest in learning. Expect positive outcomes  Education material provided: General meal planning guidelines, Food Group handout  If problems or questions, patient to contact team via: [phone]  Future DSME appointment:  11-02-17

## 2017-10-08 ENCOUNTER — Ambulatory Visit
Admission: RE | Admit: 2017-10-08 | Discharge: 2017-10-08 | Disposition: A | Discharge status: Home / Self Care | Payer: BLUE CROSS/BLUE SHIELD | Source: Ambulatory Visit | Attending: Internal Medicine | Admitting: Internal Medicine

## 2017-10-08 DIAGNOSIS — Z1231 Encounter for screening mammogram for malignant neoplasm of breast: Secondary | ICD-10-CM | POA: Insufficient documentation

## 2017-11-02 ENCOUNTER — Encounter: Payer: BLUE CROSS/BLUE SHIELD | Attending: Internal Medicine | Admitting: Dietician

## 2017-11-02 ENCOUNTER — Encounter: Payer: Self-pay | Admitting: Dietician

## 2017-11-02 VITALS — Ht 61.0 in | Wt 128.3 lb

## 2017-11-02 DIAGNOSIS — Z713 Dietary counseling and surveillance: Secondary | ICD-10-CM | POA: Insufficient documentation

## 2017-11-02 DIAGNOSIS — E119 Type 2 diabetes mellitus without complications: Secondary | ICD-10-CM

## 2017-11-02 NOTE — Progress Notes (Signed)

## 2017-11-09 VITALS — Wt 130.4 lb

## 2017-11-09 DIAGNOSIS — Z713 Dietary counseling and surveillance: Secondary | ICD-10-CM | POA: Diagnosis not present

## 2017-11-09 DIAGNOSIS — E119 Type 2 diabetes mellitus without complications: Secondary | ICD-10-CM

## 2017-11-09 NOTE — Progress Notes (Signed)

## 2017-11-16 ENCOUNTER — Encounter: Payer: BLUE CROSS/BLUE SHIELD | Admitting: Dietician

## 2017-11-16 ENCOUNTER — Encounter: Payer: Self-pay | Admitting: Dietician

## 2017-11-16 VITALS — BP 128/66 | Ht 61.0 in | Wt 129.5 lb

## 2017-11-16 DIAGNOSIS — Z713 Dietary counseling and surveillance: Secondary | ICD-10-CM | POA: Diagnosis not present

## 2017-11-16 DIAGNOSIS — E119 Type 2 diabetes mellitus without complications: Secondary | ICD-10-CM

## 2017-11-16 NOTE — Progress Notes (Signed)

## 2017-11-23 ENCOUNTER — Encounter: Payer: Self-pay | Admitting: Dietician

## 2017-11-23 NOTE — Progress Notes (Signed)
Discharge letter sent to Dr. Lamonte Sakai

## 2018-09-21 ENCOUNTER — Other Ambulatory Visit: Payer: Self-pay | Admitting: Internal Medicine

## 2018-09-21 DIAGNOSIS — Z1231 Encounter for screening mammogram for malignant neoplasm of breast: Secondary | ICD-10-CM

## 2018-10-11 ENCOUNTER — Ambulatory Visit
Admission: RE | Admit: 2018-10-11 | Discharge: 2018-10-11 | Disposition: A | Discharge status: Home / Self Care | Payer: BC Managed Care – PPO | Source: Ambulatory Visit | Attending: Internal Medicine | Admitting: Internal Medicine

## 2018-10-11 DIAGNOSIS — Z1231 Encounter for screening mammogram for malignant neoplasm of breast: Secondary | ICD-10-CM | POA: Diagnosis not present

## 2019-04-28 ENCOUNTER — Other Ambulatory Visit: Payer: Self-pay | Admitting: Internal Medicine

## 2019-04-28 DIAGNOSIS — N390 Urinary tract infection, site not specified: Secondary | ICD-10-CM

## 2019-05-06 ENCOUNTER — Ambulatory Visit
Admission: RE | Admit: 2019-05-06 | Discharge: 2019-05-06 | Disposition: A | Discharge status: Home / Self Care | Payer: BC Managed Care – PPO | Source: Ambulatory Visit | Attending: Internal Medicine | Admitting: Internal Medicine

## 2019-05-06 ENCOUNTER — Other Ambulatory Visit: Payer: Self-pay

## 2019-05-06 DIAGNOSIS — N390 Urinary tract infection, site not specified: Secondary | ICD-10-CM | POA: Diagnosis not present

## 2019-09-25 IMAGING — MG MM DIGITAL SCREENING BILAT W/ TOMO W/ CAD
6 of 10 series · 6 of 30 positions shown · non-contrast
Comparison: Previous exam(s).

CLINICAL DATA: Screening.

EXAM:
DIGITAL SCREENING BILATERAL MAMMOGRAM WITH TOMO AND CAD

[R CC synth-2D]
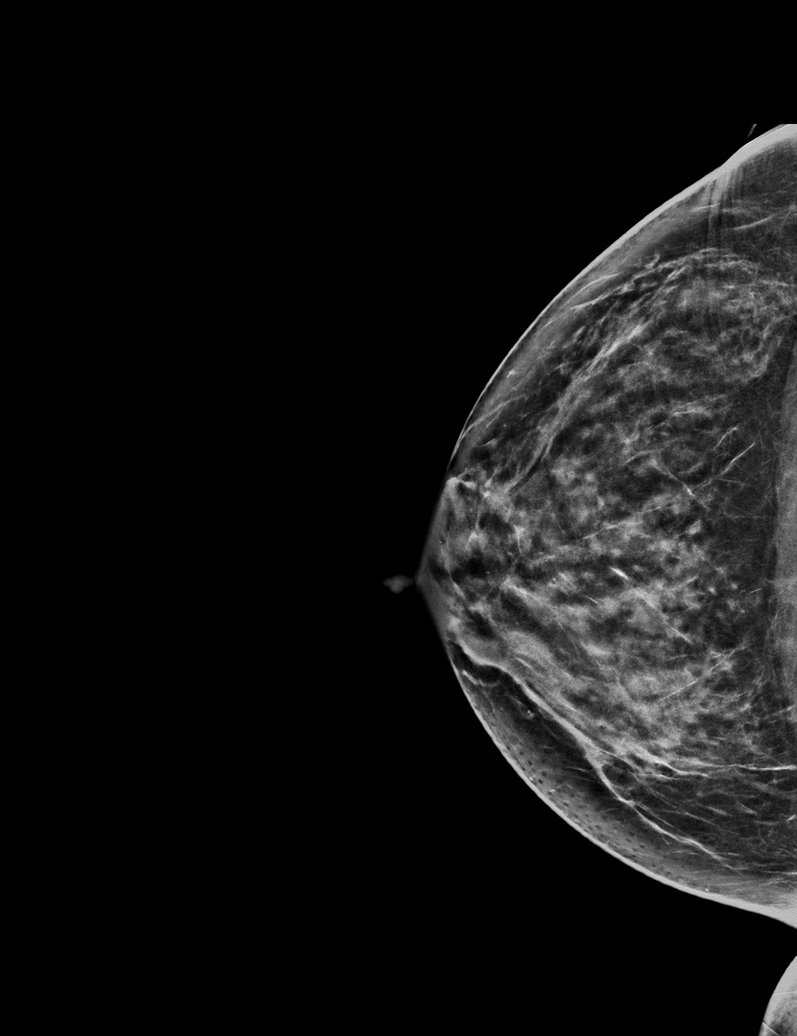

[L CC synth-2D]
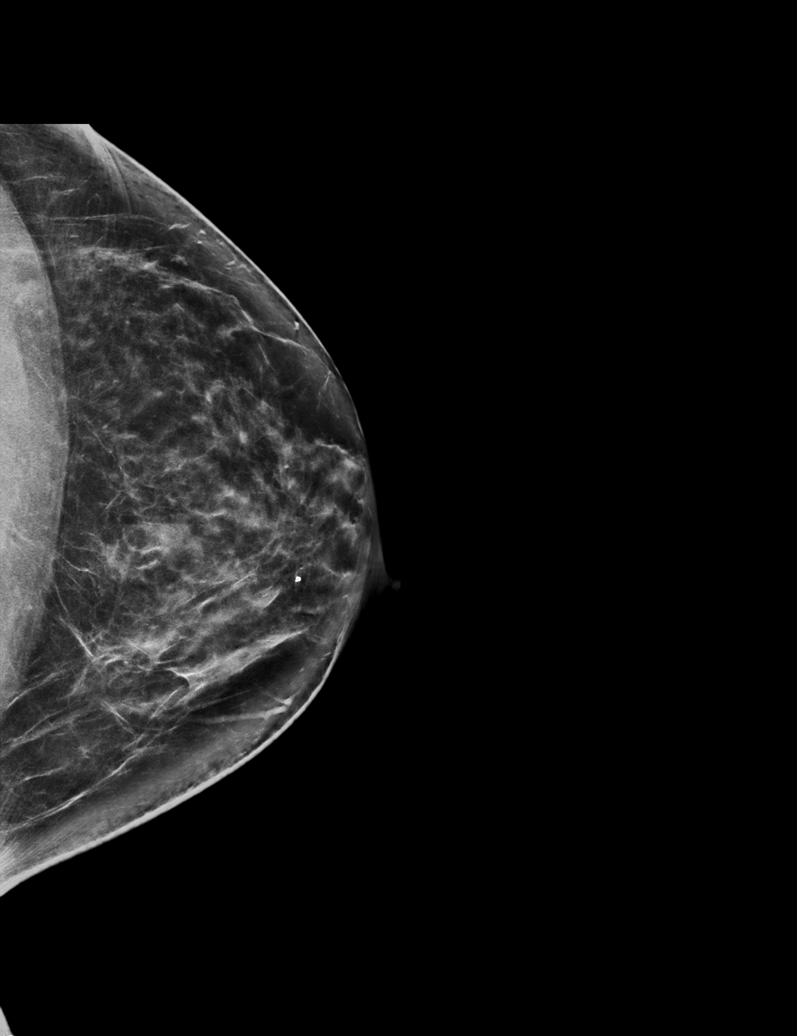

[R MLO synth-2D]
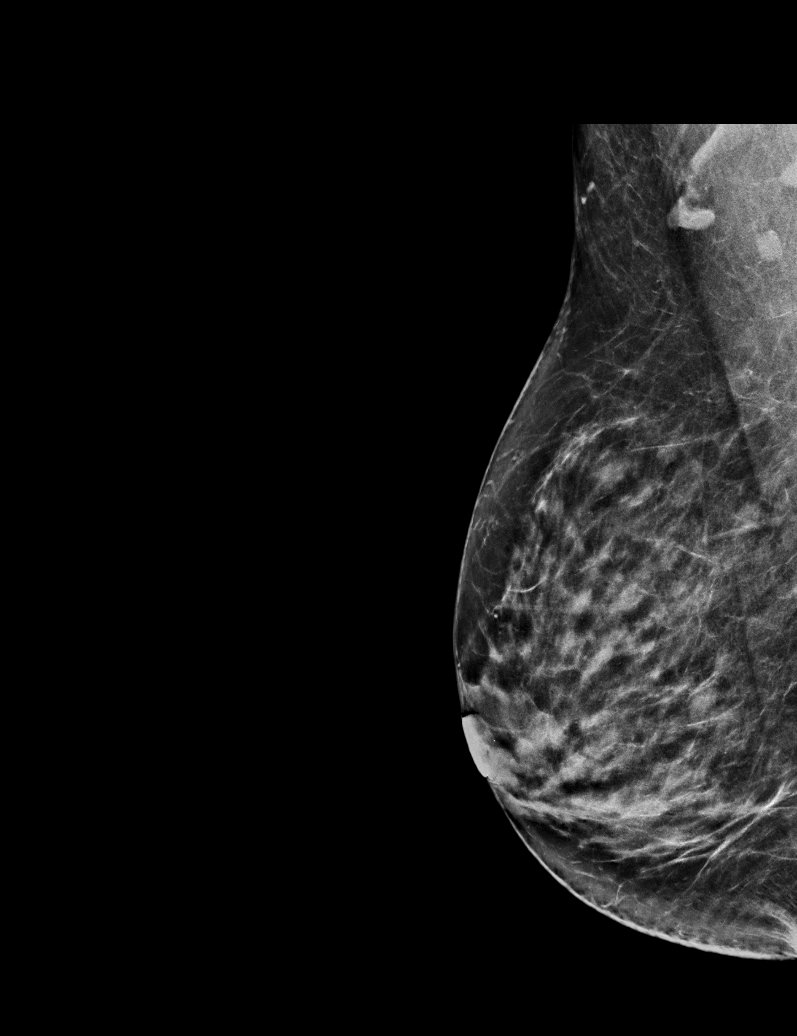

[L MLO synth-2D (1 of 2)]
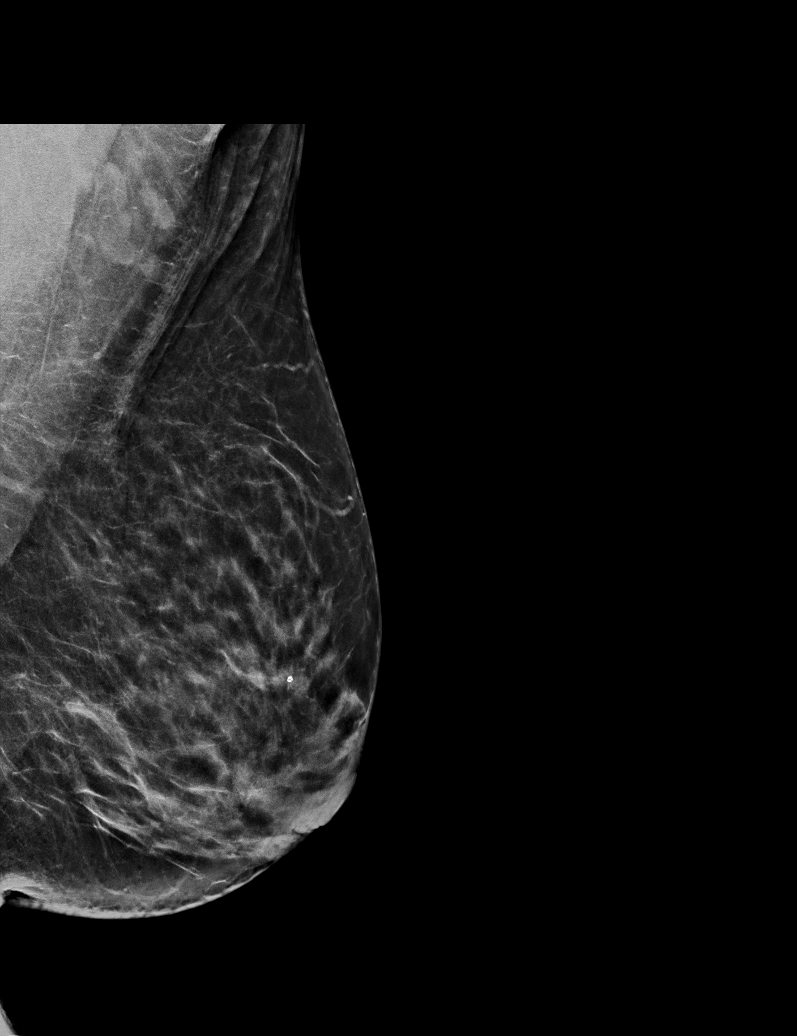

[L MLO synth-2D (2 of 2)]
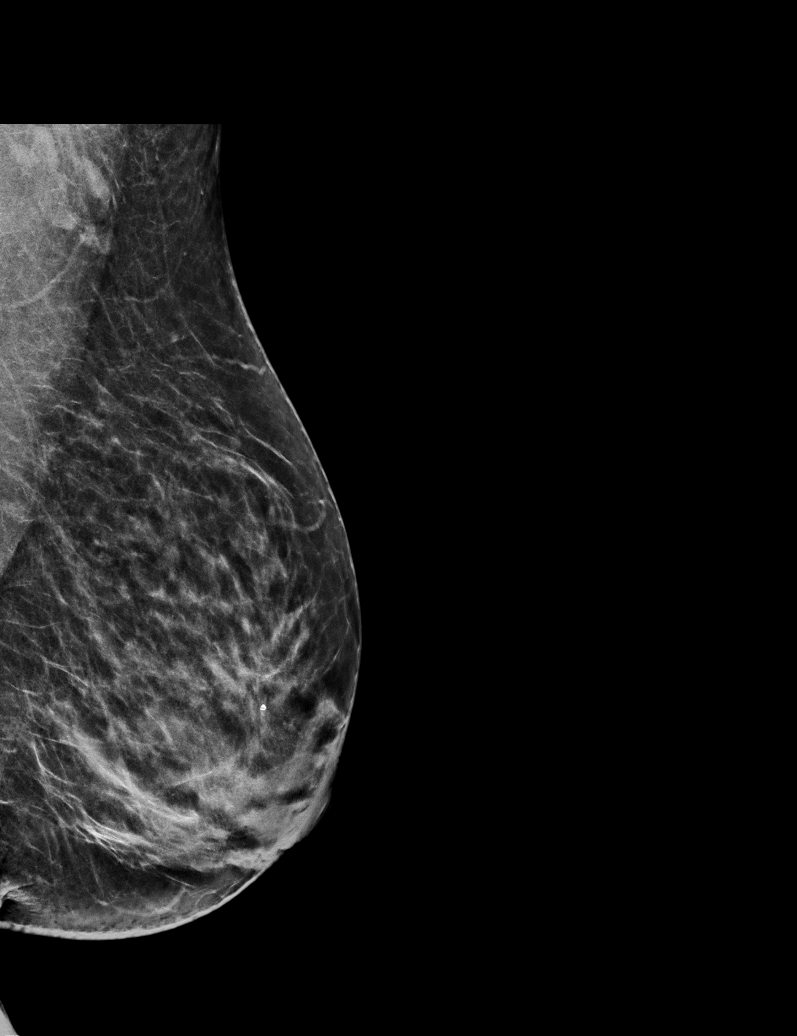

[L MLO tomo · tomo slice 32/63.0]
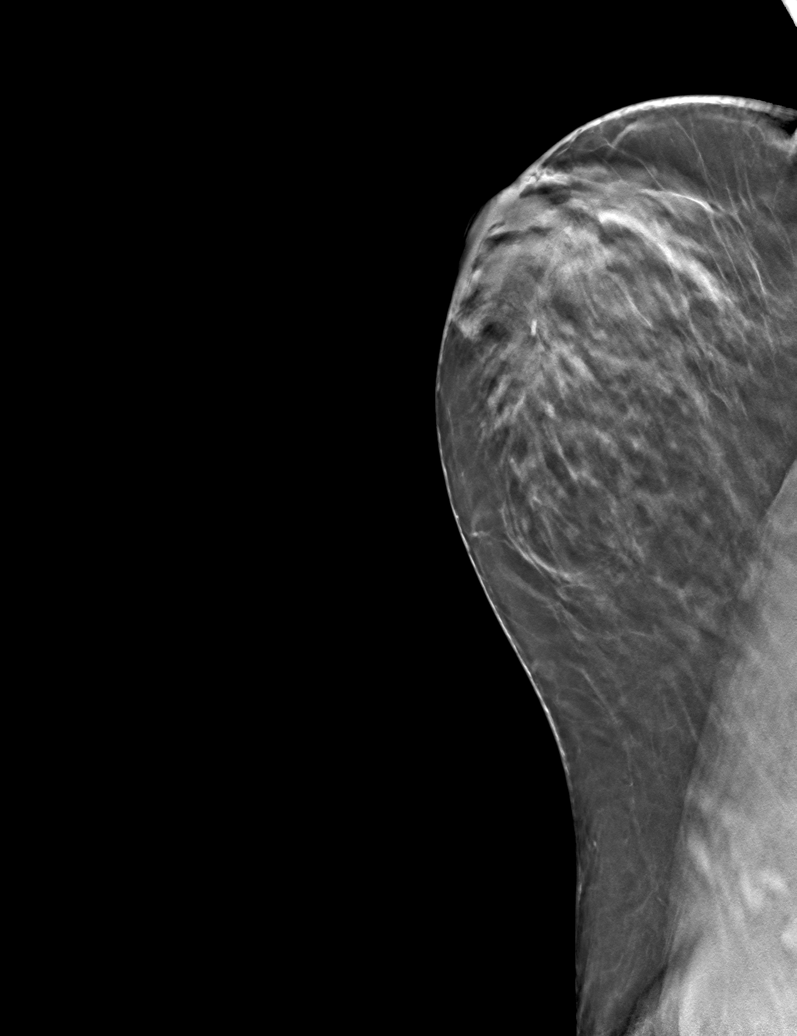

[6 of 30 positions shown; findings below may reference images not displayed]

ACR Breast Density Category c: The breast tissue is heterogeneously
dense, which may obscure small masses.
FINDINGS: There are no findings suspicious for malignancy. Images were
processed with CAD.
IMPRESSION: No mammographic evidence of malignancy. A result letter of this
screening mammogram will be mailed directly to the patient.

RECOMMENDATION:
Screening mammogram in one year. (Code:[id number])

BI-RADS CATEGORY  1: Negative.

## 2019-11-22 ENCOUNTER — Other Ambulatory Visit: Payer: Self-pay | Admitting: Internal Medicine

## 2019-11-22 DIAGNOSIS — Z1231 Encounter for screening mammogram for malignant neoplasm of breast: Secondary | ICD-10-CM

## 2019-12-28 ENCOUNTER — Ambulatory Visit
Admission: RE | Admit: 2019-12-28 | Discharge: 2019-12-28 | Disposition: A | Discharge status: Home / Self Care | Payer: BC Managed Care – PPO | Source: Ambulatory Visit | Attending: Internal Medicine | Admitting: Internal Medicine

## 2019-12-28 ENCOUNTER — Other Ambulatory Visit: Payer: Self-pay

## 2019-12-28 DIAGNOSIS — Z1231 Encounter for screening mammogram for malignant neoplasm of breast: Secondary | ICD-10-CM | POA: Insufficient documentation

## 2020-12-25 ENCOUNTER — Other Ambulatory Visit: Payer: Self-pay | Admitting: Internal Medicine

## 2020-12-25 DIAGNOSIS — N200 Calculus of kidney: Secondary | ICD-10-CM

## 2020-12-26 ENCOUNTER — Other Ambulatory Visit: Payer: Self-pay

## 2020-12-26 ENCOUNTER — Ambulatory Visit
Admission: RE | Admit: 2020-12-26 | Discharge: 2020-12-26 | Disposition: A | Discharge status: Home / Self Care | Payer: BC Managed Care – PPO | Source: Ambulatory Visit | Attending: Internal Medicine | Admitting: Internal Medicine

## 2020-12-26 DIAGNOSIS — N200 Calculus of kidney: Secondary | ICD-10-CM | POA: Insufficient documentation

## 2021-01-04 ENCOUNTER — Other Ambulatory Visit: Payer: Self-pay | Admitting: Internal Medicine

## 2021-01-04 DIAGNOSIS — Z1231 Encounter for screening mammogram for malignant neoplasm of breast: Secondary | ICD-10-CM

## 2021-01-04 DIAGNOSIS — K769 Liver disease, unspecified: Secondary | ICD-10-CM

## 2021-01-16 ENCOUNTER — Other Ambulatory Visit: Payer: Self-pay

## 2021-01-16 ENCOUNTER — Ambulatory Visit
Admission: RE | Admit: 2021-01-16 | Discharge: 2021-01-16 | Disposition: A | Discharge status: Home / Self Care | Payer: BC Managed Care – PPO | Source: Ambulatory Visit | Attending: Internal Medicine | Admitting: Internal Medicine

## 2021-01-16 DIAGNOSIS — K769 Liver disease, unspecified: Secondary | ICD-10-CM | POA: Insufficient documentation

## 2021-01-16 DIAGNOSIS — Z1231 Encounter for screening mammogram for malignant neoplasm of breast: Secondary | ICD-10-CM | POA: Diagnosis present

## 2021-01-17 ENCOUNTER — Ambulatory Visit
Admission: RE | Admit: 2021-01-17 | Discharge: 2021-01-17 | Disposition: A | Discharge status: Home / Self Care | Payer: BC Managed Care – PPO | Source: Ambulatory Visit | Attending: Internal Medicine | Admitting: Internal Medicine

## 2021-01-17 DIAGNOSIS — K769 Liver disease, unspecified: Secondary | ICD-10-CM

## 2021-01-17 MED ORDER — GADOBUTROL 1 MMOL/ML IV SOLN
6.0000 mL | Freq: Once | INTRAVENOUS | Status: AC | PRN
  Administered 2021-01-17: 09:00:00 6 mL via INTRAVENOUS

## 2021-02-07 ENCOUNTER — Encounter: Payer: Self-pay | Admitting: *Deleted

## 2021-02-08 ENCOUNTER — Encounter: Payer: Self-pay | Admitting: *Deleted

## 2021-02-08 ENCOUNTER — Ambulatory Visit: Payer: BC Managed Care – PPO | Admitting: Anesthesiology

## 2021-02-08 ENCOUNTER — Ambulatory Visit
Admission: RE | Admit: 2021-02-08 | Discharge: 2021-02-08 | Disposition: A | Discharge status: Home / Self Care | Payer: BC Managed Care – PPO | Attending: Gastroenterology | Admitting: Gastroenterology

## 2021-02-08 ENCOUNTER — Encounter
Admission: RE | Disposition: A | Discharge status: Home / Self Care | Payer: Self-pay | Source: Home / Self Care | Attending: Gastroenterology

## 2021-02-08 DIAGNOSIS — I252 Old myocardial infarction: Secondary | ICD-10-CM | POA: Insufficient documentation

## 2021-02-08 DIAGNOSIS — K640 First degree hemorrhoids: Secondary | ICD-10-CM | POA: Diagnosis not present

## 2021-02-08 DIAGNOSIS — Z951 Presence of aortocoronary bypass graft: Secondary | ICD-10-CM | POA: Insufficient documentation

## 2021-02-08 DIAGNOSIS — Z800 Family history of malignant neoplasm of digestive organs: Secondary | ICD-10-CM | POA: Diagnosis not present

## 2021-02-08 DIAGNOSIS — D122 Benign neoplasm of ascending colon: Secondary | ICD-10-CM | POA: Insufficient documentation

## 2021-02-08 DIAGNOSIS — Z7984 Long term (current) use of oral hypoglycemic drugs: Secondary | ICD-10-CM | POA: Diagnosis not present

## 2021-02-08 DIAGNOSIS — Z7982 Long term (current) use of aspirin: Secondary | ICD-10-CM | POA: Insufficient documentation

## 2021-02-08 DIAGNOSIS — Z79899 Other long term (current) drug therapy: Secondary | ICD-10-CM | POA: Insufficient documentation

## 2021-02-08 DIAGNOSIS — Z1211 Encounter for screening for malignant neoplasm of colon: Secondary | ICD-10-CM | POA: Diagnosis present

## 2021-02-08 DIAGNOSIS — I2510 Atherosclerotic heart disease of native coronary artery without angina pectoris: Secondary | ICD-10-CM | POA: Diagnosis not present

## 2021-02-08 DIAGNOSIS — E785 Hyperlipidemia, unspecified: Secondary | ICD-10-CM | POA: Diagnosis not present

## 2021-02-08 DIAGNOSIS — K219 Gastro-esophageal reflux disease without esophagitis: Secondary | ICD-10-CM | POA: Diagnosis not present

## 2021-02-08 DIAGNOSIS — I509 Heart failure, unspecified: Secondary | ICD-10-CM | POA: Insufficient documentation

## 2021-02-08 DIAGNOSIS — I110 Hypertensive heart disease with heart failure: Secondary | ICD-10-CM | POA: Diagnosis not present

## 2021-02-08 DIAGNOSIS — Z86010 Personal history of colonic polyps: Secondary | ICD-10-CM | POA: Diagnosis not present

## 2021-02-08 DIAGNOSIS — E119 Type 2 diabetes mellitus without complications: Secondary | ICD-10-CM | POA: Insufficient documentation

## 2021-02-08 HISTORY — PX: COLONOSCOPY: SHX5424

## 2021-02-08 HISTORY — DX: Type 2 diabetes mellitus without complications: E11.9

## 2021-02-08 HISTORY — DX: Acute myocardial infarction, unspecified: I21.9

## 2021-02-08 HISTORY — DX: Depression, unspecified: F32.A

## 2021-02-08 LAB — GLUCOSE, CAPILLARY: Glucose-Capillary: 117 mg/dL — ABNORMAL HIGH (ref 70–99)

## 2021-02-08 SURGERY — COLONOSCOPY
Anesthesia: General

## 2021-02-08 MED ORDER — PROPOFOL 500 MG/50ML IV EMUL
INTRAVENOUS | Status: DC | PRN
  Administered 2021-02-08: 170 ug/kg/min via INTRAVENOUS

## 2021-02-08 MED ORDER — PROPOFOL 10 MG/ML IV BOLUS
INTRAVENOUS | Status: DC | PRN
  Administered 2021-02-08: 70 mg via INTRAVENOUS

## 2021-02-08 MED ORDER — EPHEDRINE SULFATE 50 MG/ML IJ SOLN
INTRAMUSCULAR | Status: DC | PRN
  Administered 2021-02-08: 10 mg via INTRAVENOUS

## 2021-02-08 MED ORDER — PROPOFOL 500 MG/50ML IV EMUL
INTRAVENOUS | Status: AC
  Filled 2021-02-08: qty 50

## 2021-02-08 MED ORDER — SODIUM CHLORIDE 0.9 % IV SOLN
INTRAVENOUS | Status: DC
  Administered 2021-02-08: 1000 mL via INTRAVENOUS

## 2021-02-08 NOTE — Transfer of Care (Signed)
Immediate Anesthesia Transfer of Care Note  Patient: Jenna Boyer  Procedure(s) Performed: COLONOSCOPY  Patient Location: PACU  Anesthesia Type:General  Level of Consciousness: sedated  Airway & Oxygen Therapy: Patient Spontanous Breathing and Patient connected to nasal cannula oxygen  Post-op Assessment: Report given to RN and Post -op Vital signs reviewed and stable  Post vital signs: Reviewed and stable  Last Vitals:  Vitals Value Taken Time  BP 86/48 02/08/21 1140  Temp    Pulse 77 02/08/21 1142  Resp 20 02/08/21 1142  SpO2 97 % 02/08/21 1142  Vitals shown include unvalidated device data.  Last Pain:  Vitals:   02/08/21 1038  TempSrc: Temporal  PainSc: 0-No pain         Complications: No notable events documented.

## 2021-02-08 NOTE — Interval H&P Note (Signed)
History and Physical Interval Note:  02/08/2021 11:13 AM  Jenna Boyer  has presented today for surgery, with the diagnosis of History of Adenomatous polyps of colon.  The various methods of treatment have been discussed with the patient and family. After consideration of risks, benefits and other options for treatment, the patient has consented to  Procedure(s) with comments: COLONOSCOPY (N/A) - DM, BLOOD THINNER as a surgical intervention.  The patient's history has been reviewed, patient examined, no change in status, stable for surgery.  I have reviewed the patient's chart and labs.  Questions were answered to the patient's satisfaction.     Lesly Rubenstein  Ok to proceed with colonoscopy

## 2021-02-08 NOTE — Anesthesia Postprocedure Evaluation (Signed)
Anesthesia Post Note  Patient: Jenna Boyer  Procedure(s) Performed: COLONOSCOPY  Patient location during evaluation: Endoscopy Anesthesia Type: General Level of consciousness: awake and alert Pain management: pain level controlled Vital Signs Assessment: post-procedure vital signs reviewed and stable Respiratory status: spontaneous breathing, nonlabored ventilation, respiratory function stable and patient connected to nasal cannula oxygen Cardiovascular status: blood pressure returned to baseline and stable Postop Assessment: no apparent nausea or vomiting Anesthetic complications: no   No notable events documented.   Last Vitals:  Vitals:   02/08/21 1202 02/08/21 1208  BP: 102/65   Pulse: 80 78  Resp: 13 12  Temp:    SpO2: 100% 100%    Last Pain:  Vitals:   02/08/21 1140  TempSrc: Temporal  PainSc:                  Precious Haws Aleasha Fregeau

## 2021-02-08 NOTE — H&P (Signed)
Outpatient short stay form Pre-procedure 02/08/2021  Lesly Rubenstein, MD  Primary Physician: Perrin Maltese, MD  Reason for visit:  Surveillance colonoscopy  History of present illness:   56 y/o lady with history of CAD on brilinta with last dose 4 days ago here for surveillance colonoscopy. Last colonoscopy was 5 years ago and was normal. Had polyps on index colonoscopy that were adenomatous. Mother with history of colon cancer. History of appendectomy and hysterectomy. No new symptoms.    Current Facility-Administered Medications:    0.9 %  sodium chloride infusion, , Intravenous, Continuous, Janeann Paisley, Hilton Cork, MD, Last Rate: 20 mL/hr at 02/08/21 1100, 1,000 mL at 02/08/21 1100  Medications Prior to Admission  Medication Sig Dispense Refill Last Dose   aspirin EC 81 MG tablet Take 81 mg by mouth daily.   Past Week   Cholecalciferol (VITAMIN D3) 50000 units CAPS Take 1 capsule by mouth once a week.  3 Past Week   ezetimibe (ZETIA) 10 MG tablet Take 10 mg by mouth daily.   Past Week   glimepiride (AMARYL) 2 MG tablet Take 4 mg by mouth 2 (two) times daily.   6 Past Week   losartan (COZAAR) 50 MG tablet Take 50 mg by mouth daily.   02/08/2021 at 0545   metFORMIN (GLUCOPHAGE) 500 MG tablet Take 500 mg by mouth daily.   Past Week   metoprolol succinate (TOPROL-XL) 50 MG 24 hr tablet TAKE 1 TABLET BY MOUTH EVERY DAY--NEW DIRECTIONS  1 02/08/2021 at 0545   pantoprazole (PROTONIX) 40 MG tablet Take 40 mg by mouth daily.  0 Past Week   rosuvastatin (CRESTOR) 40 MG tablet Take 1 tablet (40 mg total) by mouth every evening. 30 tablet 0 Past Week   digoxin (LANOXIN) 0.25 MG tablet Take 1 tablet (0.25 mg total) by mouth daily. (Patient not taking: Reported on 02/08/2021) 30 tablet 0 Not Taking   ferrous sulfate 325 (65 FE) MG tablet Take 325 mg by mouth daily. (Patient not taking: Reported on 02/08/2021)  6 Not Taking   fexofenadine (ALLEGRA) 180 MG tablet Take 180 mg by mouth daily.    at prn    metoprolol tartrate (LOPRESSOR) 25 MG tablet Take 0.5 tablets (12.5 mg total) by mouth 2 (two) times daily. Hold if sbp<100 or heart rate less than 50 (Patient not taking: Reported on 02/23/2017) 30 tablet 11    metoprolol tartrate (LOPRESSOR) 25 MG tablet Take 0.5 tablets (12.5 mg total) by mouth 2 (two) times daily. (Patient not taking: Reported on 02/23/2017) 30 tablet 0    midodrine (PROAMATINE) 10 MG tablet Take 1 tablet (10 mg total) by mouth 3 (three) times daily with meals. (Patient not taking: Reported on 02/23/2017) 90 tablet 0    midodrine (PROAMATINE) 5 MG tablet TAKE 1 TABLET BY MOUTH ONCE DAILY. NOTE DOSE DECREASE. (Patient not taking: Reported on 02/08/2021)  1 Not Taking   nitroGLYCERIN (NITROSTAT) 0.4 MG SL tablet Place 1 tablet (0.4 mg total) under the tongue every 5 (five) minutes as needed for chest pain. (Patient not taking: Reported on 10/05/2017) 10 tablet 12    ticagrelor (BRILINTA) 90 MG TABS tablet Take 1 tablet (90 mg total) by mouth 2 (two) times daily. 60 tablet 2 02/04/2021     Allergies  Allergen Reactions   Enalapril Maleate Itching     Past Medical History:  Diagnosis Date   CAD (coronary artery disease)    CHF (congestive heart failure) (HCC)    Depression  Diabetes mellitus without complication (HCC)    GERD (gastroesophageal reflux disease)    Hyperlipemia    Hypertension    Myocardial infarction [facility])     Review of systems:  Otherwise negative.    Physical Exam  Gen: Alert, oriented. Appears stated age.  HEENT: PERRLA CV: RRR Pulm: No respiratory distress Abd: soft, benign, no masses Ext: No edema.     Planned procedures: Proceed with colonoscopy. The patient understands the nature of the planned procedure, indications, risks, alternatives and potential complications including but not limited to bleeding, infection, perforation, damage to internal organs and possible oversedation/side effects from anesthesia. The patient agrees and gives consent  to proceed.  Please refer to procedure notes for findings, recommendations and patient disposition/instructions.     Lesly Rubenstein, MD [facility] Gastroenterology

## 2021-02-08 NOTE — Anesthesia Procedure Notes (Signed)
Date/Time: 02/08/2021 11:29 AM Performed by: Nelda Marseille, CRNA Oxygen Delivery Method: Nasal cannula Ventilation: Oral airway inserted - appropriate to patient size

## 2021-02-08 NOTE — Anesthesia Preprocedure Evaluation (Signed)
Anesthesia Evaluation  Patient identified by MRN, date of birth, ID band Patient awake    Reviewed: Allergy & Precautions, NPO status , Patient's Chart, lab work & pertinent test results  History of Anesthesia Complications Negative for: history of anesthetic complications  Airway Mallampati: III  TM Distance: >3 FB Neck ROM: full    Dental  (+) Chipped   Pulmonary neg shortness of breath,    Pulmonary exam normal        Cardiovascular hypertension, (-) angina+ CAD, + Past MI and +CHF  Normal cardiovascular exam     Neuro/Psych PSYCHIATRIC DISORDERS negative neurological ROS     GI/Hepatic Neg liver ROS, GERD  Controlled,  Endo/Other  diabetes, Type 2  Renal/GU negative Renal ROS  negative genitourinary   Musculoskeletal   Abdominal   Peds  Hematology negative hematology ROS (+)   Anesthesia Other Findings Past Medical History: No date: CAD (coronary artery disease) No date: CHF (congestive heart failure) (HCC) No date: Depression No date: Diabetes mellitus without complication (HCC) No date: GERD (gastroesophageal reflux disease) No date: Hyperlipemia No date: Hypertension No date: Myocardial infarction [facility])  Past Surgical History: No date: ABDOMINAL HYSTERECTOMY No date: APPENDECTOMY No date: CORONARY ARTERY BYPASS GRAFT 01/25/2017: CORONARY/GRAFT ACUTE MI REVASCULARIZATION; N/A     Comment:  Procedure: Coronary/Graft Acute MI Revascularization;                Surgeon: Nelva Bush, MD;  Location: [place]               CV LAB;  Service: Cardiovascular;  Laterality: N/A; 01/25/2017: INTRAVASCULAR ULTRASOUND/IVUS; N/A     Comment:  Procedure: Intravascular Ultrasound/IVUS;  Surgeon: Nelva Bush, MD;  Location: [facility] CV LAB;                Service: Cardiovascular;  Laterality: N/A; 01/25/2017: LEFT HEART CATH AND CORONARY ANGIOGRAPHY; N/A     Comment:  Procedure:  LEFT HEART CATH AND CORONARY ANGIOGRAPHY;                Surgeon: Nelva Bush, MD;  Location: [place]               CV LAB;  Service: Cardiovascular;  Laterality: N/A; No date: MYOMECTOMY No date: OVARIAN CYST REMOVAL  BMI    Body Mass Index: 26.32 kg/m      Reproductive/Obstetrics negative OB ROS                             Anesthesia Physical Anesthesia Plan  ASA: 3  Anesthesia Plan: General   Post-op Pain Management:    Induction: Intravenous  PONV Risk Score and Plan: Propofol infusion and TIVA  Airway Management Planned: Natural Airway and Nasal Cannula  Additional Equipment:   Intra-op Plan:   Post-operative Plan:   Informed Consent: I have reviewed the patients History and Physical, chart, labs and discussed the procedure including the risks, benefits and alternatives for the proposed anesthesia with the patient or authorized representative who has indicated his/her understanding and acceptance.     Dental Advisory Given  Plan Discussed with: Anesthesiologist, CRNA and Surgeon  Anesthesia Plan Comments: (Patient consented for risks of anesthesia including but not limited to:  - adverse reactions to medications - risk of airway placement if required - damage to eyes, teeth, lips or other oral mucosa - nerve damage  due to positioning  - sore throat or hoarseness - Damage to heart, brain, nerves, lungs, other parts of body or loss of life  Patient voiced understanding.)        Anesthesia Quick Evaluation

## 2021-02-08 NOTE — Op Note (Signed)
[facility] Gastroenterology Patient Name: Jenna Boyer Procedure Date: 02/08/2021 11:16 AM MRN: [id number] Account #: [id number] Date of Birth: 10-25-1965 Admit Type: Outpatient Age: 56 Room: [facility] ENDO ROOM 3 Gender: Female Note Status: Finalized Instrument Name: Park Meo [id number] Procedure:             Colonoscopy Indications:           High risk colon cancer surveillance: Personal history                         of non-advanced adenoma, Last colonoscopy 5 years ago,                         Family history of colon cancer Providers:             Andrey Farmer MD, MD Referring MD:          Perrin Maltese, MD (Referring MD) Medicines:             Monitored Anesthesia Care Complications:         No immediate complications. Estimated blood loss:                         Minimal. Procedure:             Pre-Anesthesia Assessment:                        - Prior to the procedure, a History and Physical was                         performed, and patient medications and allergies were                         reviewed. The patient is competent. The risks and                         benefits of the procedure and the sedation options and                         risks were discussed with the patient. All questions                         were answered and informed consent was obtained.                         Patient identification and proposed procedure were                         verified by the physician, the nurse, the                         anesthesiologist, the anesthetist and the technician                         in the endoscopy suite. Mental Status Examination:                         alert and oriented. Airway Examination: normal  oropharyngeal airway and neck mobility. Respiratory                         Examination: clear to auscultation. CV Examination:                         normal. Prophylactic Antibiotics: The patient does not                          require prophylactic antibiotics. Prior                         Anticoagulants: The patient has taken antiplatelet                         medication, last dose was 4 days prior to procedure.                         ASA Grade Assessment: II - A patient with mild                         systemic disease. After reviewing the risks and                         benefits, the patient was deemed in satisfactory                         condition to undergo the procedure. The anesthesia                         plan was to use monitored anesthesia care (MAC).                         Immediately prior to administration of medications,                         the patient was re-assessed for adequacy to receive                         sedatives. The heart rate, respiratory rate, oxygen                         saturations, blood pressure, adequacy of pulmonary                         ventilation, and response to care were monitored                         throughout the procedure. The physical status of the                         patient was re-assessed after the procedure.                        After obtaining informed consent, the colonoscope was                         passed under direct vision. Throughout the procedure,  the patient's blood pressure, pulse, and oxygen                         saturations were monitored continuously. The                         Colonoscope was introduced through the anus and                         advanced to the the terminal ileum. The colonoscopy                         was performed without difficulty. The patient                         tolerated the procedure well. The quality of the bowel                         preparation was excellent. Findings:      The perianal and digital rectal examinations were normal.      The terminal ileum appeared normal.      A 3 mm polyp was found in the ascending colon. The polyp was  sessile.       The polyp was removed with a cold snare. Resection and retrieval were       complete. Estimated blood loss was minimal.      Internal hemorrhoids were found during retroflexion. The hemorrhoids       were Grade I (internal hemorrhoids that do not prolapse).      The exam was otherwise without abnormality on direct and retroflexion       views. Impression:            - The examined portion of the ileum was normal.                        - One 3 mm polyp in the ascending colon, removed with                         a cold snare. Resected and retrieved.                        - Internal hemorrhoids.                        - The examination was otherwise normal on direct and                         retroflexion views. Recommendation:        - Discharge patient to home.                        - Resume previous diet.                        - Continue present medications.                        - Await pathology results.                        -  Repeat colonoscopy in 5 years for surveillance.                        - Return to referring physician as previously                         scheduled. Procedure Code(s):     --- Professional ---                        [phone], Colonoscopy, flexible; with removal of                         tumor(s), polyp(s), or other lesion(s) by snare                         technique Diagnosis Code(s):     --- Professional ---                        Z86.010, Personal history of colonic polyps                        K64.0, First degree hemorrhoids                        K63.5, Polyp of colon                        Z80.0, Family history of malignant neoplasm of                         digestive organs CPT copyright 2019 [employer]. All rights reserved. The codes documented in this report are preliminary and upon coder review may  be revised to meet current compliance requirements. Andrey Farmer MD, MD 02/08/2021 11:40:28 AM Number  of Addenda: 0 Note Initiated On: 02/08/2021 11:16 AM Scope Withdrawal Time: 0 hours 8 minutes 24 seconds  Total Procedure Duration: 0 hours 13 minutes 43 seconds  Estimated Blood Loss:  Estimated blood loss was minimal.      [facility]

## 2021-02-11 LAB — SURGICAL PATHOLOGY

## 2021-02-26 DIAGNOSIS — I2510 Atherosclerotic heart disease of native coronary artery without angina pectoris: Secondary | ICD-10-CM | POA: Insufficient documentation

## 2021-03-17 ENCOUNTER — Other Ambulatory Visit: Payer: Self-pay

## 2021-03-17 ENCOUNTER — Encounter: Payer: Self-pay | Admitting: Emergency Medicine

## 2021-03-17 ENCOUNTER — Emergency Department
Admission: EM | Admit: 2021-03-17 | Discharge: 2021-03-17 | Disposition: A | Discharge status: Home / Self Care | Payer: BC Managed Care – PPO | Attending: Emergency Medicine | Admitting: Emergency Medicine

## 2021-03-17 ENCOUNTER — Emergency Department
Admission: EM | Admit: 2021-03-17 | Discharge: 2021-03-17 | Disposition: A | Discharge status: Home / Self Care | Payer: BC Managed Care – PPO | Source: Home / Self Care | Attending: Emergency Medicine | Admitting: Emergency Medicine

## 2021-03-17 DIAGNOSIS — Z7982 Long term (current) use of aspirin: Secondary | ICD-10-CM | POA: Insufficient documentation

## 2021-03-17 DIAGNOSIS — I2510 Atherosclerotic heart disease of native coronary artery without angina pectoris: Secondary | ICD-10-CM | POA: Insufficient documentation

## 2021-03-17 DIAGNOSIS — R040 Epistaxis: Secondary | ICD-10-CM | POA: Insufficient documentation

## 2021-03-17 DIAGNOSIS — I509 Heart failure, unspecified: Secondary | ICD-10-CM | POA: Insufficient documentation

## 2021-03-17 DIAGNOSIS — E119 Type 2 diabetes mellitus without complications: Secondary | ICD-10-CM | POA: Insufficient documentation

## 2021-03-17 DIAGNOSIS — I110 Hypertensive heart disease with heart failure: Secondary | ICD-10-CM | POA: Insufficient documentation

## 2021-03-17 LAB — CBC WITH DIFFERENTIAL/PLATELET
Abs Immature Granulocytes: 0.01 10*3/uL (ref 0.00–0.07)
Basophils Absolute: 0.1 10*3/uL (ref 0.0–0.1)
Basophils Relative: 1 %
Eosinophils Absolute: 0.2 10*3/uL (ref 0.0–0.5)
Eosinophils Relative: 2 %
HCT: 41.1 % (ref 36.0–46.0)
Hemoglobin: 12.8 g/dL (ref 12.0–15.0)
Immature Granulocytes: 0 %
Lymphocytes Relative: 21 %
Lymphs Abs: 1.6 10*3/uL (ref 0.7–4.0)
MCH: 23.7 pg — ABNORMAL LOW (ref 26.0–34.0)
MCHC: 31.1 g/dL (ref 30.0–36.0)
MCV: 76.3 fL — ABNORMAL LOW (ref 80.0–100.0)
Monocytes Absolute: 0.8 10*3/uL (ref 0.1–1.0)
Monocytes Relative: 11 %
Neutro Abs: 5.1 10*3/uL (ref 1.7–7.7)
Neutrophils Relative %: 65 %
Platelets: 283 10*3/uL (ref 150–400)
RBC: 5.39 MIL/uL — ABNORMAL HIGH (ref 3.87–5.11)
RDW: 16.6 % — ABNORMAL HIGH (ref 11.5–15.5)
WBC: 7.7 10*3/uL (ref 4.0–10.5)
nRBC: 0 % (ref 0.0–0.2)

## 2021-03-17 LAB — COMPREHENSIVE METABOLIC PANEL
ALT: 28 U/L (ref 0–44)
AST: 26 U/L (ref 15–41)
Albumin: 4.4 g/dL (ref 3.5–5.0)
Alkaline Phosphatase: 86 U/L (ref 38–126)
Anion gap: 11 (ref 5–15)
BUN: 18 mg/dL (ref 6–20)
CO2: 26 mmol/L (ref 22–32)
Calcium: 10.3 mg/dL (ref 8.9–10.3)
Chloride: 105 mmol/L (ref 98–111)
Creatinine, Ser: 0.93 mg/dL (ref 0.44–1.00)
GFR, Estimated: >60 mL/min (ref >60)
Glucose, Bld: 177 mg/dL — ABNORMAL HIGH (ref 70–99)
Potassium: 4 mmol/L (ref 3.5–5.1)
Sodium: 142 mmol/L (ref 135–145)
Total Bilirubin: 0.6 mg/dL (ref 0.3–1.2)
Total Protein: 8.2 g/dL — ABNORMAL HIGH (ref 6.5–8.1)

## 2021-03-17 LAB — PROTIME-INR
INR: 1 (ref 0.8–1.2)
Prothrombin Time: 12.7 seconds (ref 11.4–15.2)

## 2021-03-17 LAB — APTT: aPTT: 31 seconds (ref 24–36)

## 2021-03-17 MED ORDER — OXYMETAZOLINE HCL 0.05 % NA SOLN
1.0000 | Freq: Once | NASAL | Status: AC
  Administered 2021-03-17: 1 via NASAL
  Filled 2021-03-17: qty 30

## 2021-03-17 MED ORDER — TRANEXAMIC ACID FOR EPISTAXIS
500.0000 mg | Freq: Once | TOPICAL | Status: DC
  Filled 2021-03-17: qty 10

## 2021-03-17 MED ORDER — LIDOCAINE VISCOUS HCL 2 % MT SOLN
15.0000 mL | Freq: Once | OROMUCOSAL | Status: AC
  Administered 2021-03-17: 15 mL via OROMUCOSAL
  Filled 2021-03-17: qty 15

## 2021-03-17 NOTE — ED Triage Notes (Signed)
Pt to ED via POV with c/o nose bleed, she was seen here earlier for the same. She is on blood thinners. Pt A&O and NAD

## 2021-03-17 NOTE — ED Provider Notes (Signed)
[facility] Provider Note    Event Date/Time   First MD Initiated Contact with Patient 03/17/21 1957     (approximate)   History   Epistaxis   HPI  Jenna Boyer is a 56 y.o. female with a history of CHF, CAD, DM, and hypertension, on aspirin and Brilinta, who presents with a recurrent nosebleed since this afternoon.  The patient initially coughed while drinking water and then started having nosebleed primarily out of the left nare.  She was seen in the ED earlier this afternoon and got nasal packing with Merocel in the left nostril.  However, subsequently the patient developed recurrent bleeding, with a small amount of blood coming out around the packing, some blood coming out of the right nare, and she is also coughing up clots intermittently.  She denies any severe headache, dizziness or lightheadedness, or other abnormal bleeding or bruising.     Physical Exam   Triage Vital Signs: ED Triage Vitals  Enc Vitals Group     BP 03/17/21 1922 (!) 160/79     Pulse Rate 03/17/21 1922 (!) 109     Resp 03/17/21 1922 18     Temp 03/17/21 1922 98.3 F (36.8 C)     Temp Source 03/17/21 1922 Oral     SpO2 03/17/21 1922 97 %     Weight 03/17/21 1856 136 lb (61.7 kg)     Height 03/17/21 1856 5\' 1"  (1.549 m)     Head Circumference --      Peak Flow --      Pain Score 03/17/21 1856 0     Pain Loc --      Pain Edu? --      Excl. in [place]? --     Most recent vital signs: Vitals:   03/17/21 2011 03/17/21 2221  BP: 133/72 131/76  Pulse: 97 90  Resp: 18 16  Temp:    SpO2: 97% 99%     General: Awake, no distress.  CV:  Good peripheral perfusion.  Resp:  Normal effort.  Abd:  No distention.  Other:  Left nare with Merocel in place, small amount of blood oozing around it.  Right nare with no active bleeding.   ED Results / Procedures / Treatments   Labs (all labs ordered are listed, but only abnormal results are displayed) Labs Reviewed  COMPREHENSIVE  METABOLIC PANEL - Abnormal; Notable for the following components:      Result Value   Glucose, Bld 177 (*)    Total Protein 8.2 (*)    All other components within normal limits  CBC WITH DIFFERENTIAL/PLATELET - Abnormal; Notable for the following components:   RBC 5.39 (*)    MCV 76.3 (*)    MCH 23.7 (*)    RDW 16.6 (*)    All other components within normal limits  PROTIME-INR  APTT     EKG    RADIOLOGY   PROCEDURES:  Critical Care performed: No  Procedures    MEDICATIONS ORDERED IN ED: Medications  tranexamic acid (CYKLOKAPRON) 1000 MG/10ML topical solution 500 mg (has no administration in time range)  lidocaine (XYLOCAINE) 2 % viscous mouth solution 15 mL (15 mLs Mouth/Throat Given 03/17/21 2130)     IMPRESSION / MDM / ASSESSMENT AND PLAN / ED COURSE  I reviewed the triage vital signs and the nursing notes.  56 year old female with PMH as noted above including CAD on aspirin and Brilinta presents with recurrent nosebleed.  On exam the  patient is overall well-appearing and her vital signs are normal.  Lab work-up was reviewed I reviewed the lab work-up.  The patient's hemoglobin and hematocrit are normal.  Her platelets are normal, as are her coags.  CMP is unremarkable.  There is no evidence of significant blood loss.  I remove the Merocel with initial plan to cauterize what is likely an anterior nosebleed.  However, on examination of the nasal passages, the left nasal passage has a diffuse area of erythematous and irritated appearing mucosa anteriorly, with no focal area of bleeding.  However the patient is still having gradual oozing of blood from the side.  She was given Afrin earlier today with no resolution of the bleeding.  Therefore, due to refractory bleeding, I will repack with Merocel with TXA and reassess.   ----------------------------------------- 10:31 PM on 03/17/2021 -----------------------------------------  After removing the Merocel and  applying viscous lidocaine, before I return to repack the nose, the bleeding stopped.  I have observed the patient now for an additional hour and she has had no recurrence of the bleeding.  Therefore, there is no indication for further ED intervention and she is stable for discharge home.  I have advised her to use Afrin twice daily for the next few days, avoid blowing her nose, hold her aspirin and Brilinta for a day, and follow-up with her regular doctor.  I gave the patient thorough return precautions, and she expressed understanding.   FINAL CLINICAL IMPRESSION(S) / ED DIAGNOSES   Final diagnoses:  Anterior epistaxis     Rx / DC Orders   ED Discharge Orders     None        Note:  This document was prepared using Dragon voice recognition software and may include unintentional dictation errors.    Arta Silence, MD 03/17/21 2232

## 2021-03-17 NOTE — ED Notes (Signed)
Pt states throat feels like 'sandpaper.'

## 2021-03-17 NOTE — ED Provider Notes (Signed)
[facility] Provider Note    Event Date/Time   First MD Initiated Contact with Patient 03/17/21 1307     (approximate)   History   Epistaxis   HPI  Jenna Boyer is a 56 y.o. female with history of CHF, DM, hypertension, MI and as listed in EMR presents to the emergency department for treatment and evaluation of epistaxis. She is on  ASA and Brilinta. She has never had a nosebleed. While drinking water, she coughed and then nose started bleeding. She called the nurse line and was advised to come to the ER.     Physical Exam   Triage Vital Signs: ED Triage Vitals  Enc Vitals Group     BP 03/17/21 1229 (!) 155/86     Pulse Rate 03/17/21 1229 100     Resp 03/17/21 1229 16     Temp 03/17/21 1229 98.4 F (36.9 C)     Temp Source 03/17/21 1229 Oral     SpO2 03/17/21 1229 100 %     Weight 03/17/21 1230 136 lb (61.7 kg)     Height 03/17/21 1230 5\' 1"  (1.549 m)     Head Circumference --      Peak Flow --      Pain Score 03/17/21 1230 0     Pain Loc --      Pain Edu? --      Excl. in [place]? --     Most recent vital signs: Vitals:   03/17/21 1229  BP: (!) 155/86  Pulse: 100  Resp: 16  Temp: 98.4 F (36.9 C)  SpO2: 100%    General: Awake, no distress.  CV:  Good peripheral perfusion.  Resp:  Normal effort.  Abd:  No distention.  Other:     ED Results / Procedures / Treatments   Labs (all labs ordered are listed, but only abnormal results are displayed) Labs Reviewed - No data to display   EKG     RADIOLOGY  Image and radiology report reviewed by me.    PROCEDURES:  Critical Care performed: No  .Epistaxis Management  Date/Time: 03/17/2021 3:07 PM Performed by: Victorino Dike, FNP Authorized by: Victorino Dike, FNP   Consent:    Consent obtained:  Verbal   Consent given by:  Patient   Risks discussed:  Bleeding Universal protocol:    Patient identity confirmed:  Verbally with patient Procedure details:     Treatment site:  Unable to specify   Treatment method:  Anterior pack and merocel sponge   Treatment complexity:  Limited   Treatment episode: initial   Post-procedure details:    Procedure completion:  Tolerated well, no immediate complications   MEDICATIONS ORDERED IN ED: Medications  oxymetazoline (AFRIN) 0.05 % nasal spray 1 spray (1 spray Each Nare Given by Other 03/17/21 1337)     IMPRESSION / MDM / [place] / ED COURSE   I have reviewed the triage note.  Differential diagnosis includes, but is not limited to, anterior epistaxis, posterior epistaxis.  56 year old female presenting to the ER for treatment and evaluation of epistaxis. See HPI for details.  Afrin instilled into both nostrils, then clamped for 15 minutes. When clamp was removed, left side continued to bleed. Merocel inserted. Observed for about 30 minutes afterward. No bleeding. She is to follow up with ENT for packing removal. ER return precautions discussed.     FINAL CLINICAL IMPRESSION(S) / ED DIAGNOSES   Final diagnoses:  Acute anterior epistaxis     Rx / DC Orders   ED Discharge Orders     None        Note:  This document was prepared using Dragon voice recognition software and may include unintentional dictation errors.   Victorino Dike, FNP 03/17/21 1514    Vanessa Wexford, MD 03/19/21 [phone]

## 2021-03-17 NOTE — ED Triage Notes (Signed)
Pt via POV from home. Pt c/o of a nose bleed that started this AM. Pt is on blood thinners and has a hx of HTN. Bleeding controlled. Nasal clip applied. Pt is A&OX4 and NAD.

## 2021-03-17 NOTE — Discharge Instructions (Signed)
Avoid blowing your nose for the next 1 to 2 days.  You should use 1 to 2 sprays of over-the-counter Afrin (oxymetazoline) into each nostril twice daily over the next 2 days to help constrict the blood vessels.  Do not take your Brilinta or aspirin tonight or tomorrow morning.  You can resume tomorrow evening if you have no further symptoms.  If you have recurrent mild bleeding you should spray the Afrin and apply the clip on your nose as instructed (in front of but not on the nasal bone) and keep pressure on the nose for at least 15 to 20 minutes.  Return to the ER immediately for persistent or worsening bleeding or any other new or worsening symptoms that concern you.

## 2021-03-17 NOTE — Discharge Instructions (Signed)
Please call to schedule a follow up with ENT.  Return to the ER if bleeding returns.  Do not blow your nose until cleared by the specialist.

## 2021-05-21 ENCOUNTER — Ambulatory Visit (INDEPENDENT_AMBULATORY_CARE_PROVIDER_SITE_OTHER): Payer: BC Managed Care – PPO | Admitting: Podiatry

## 2021-05-21 DIAGNOSIS — L603 Nail dystrophy: Secondary | ICD-10-CM

## 2021-05-28 NOTE — Progress Notes (Signed)
?Subjective:  ?Patient ID: Jenna Boyer, female    DOB: 1965/12/22,  MRN: [id number] ? ?Chief Complaint  ?Patient presents with  ? Nail Problem  ?  Nail discoloration pt is concerned about possible fungus   ? ? ?56 y.o. female presents with the above complaint.  Patient presents with complaint of right hallux nail dystrophy.  She states there is some discoloration to the nail but she is worried about fungus.  She does recall some areas of microtrauma to the area.  She states not painful.  She wanted get it evaluated she is a diabetic.  She denies any other acute issues. ? ? ?Review of Systems: Negative except as noted in the HPI. Denies N/V/F/Ch. ? ?Past Medical History:  ?Diagnosis Date  ? CAD (coronary artery disease)   ? CHF (congestive heart failure) ([place])   ? Depression   ? Diabetes mellitus without complication ([place])   ? GERD (gastroesophageal reflux disease)   ? Hyperlipemia   ? Hypertension   ? Myocardial infarction [facility])   ? ? ?Current Outpatient Medications:  ?  aspirin EC 81 MG tablet, Take 81 mg by mouth daily., Disp: , Rfl:  ?  Cholecalciferol (VITAMIN D3) 50000 units CAPS, Take 1 capsule by mouth once a week., Disp: , Rfl: 3 ?  digoxin (LANOXIN) 0.25 MG tablet, Take 1 tablet (0.25 mg total) by mouth daily. (Patient not taking: Reported on 02/08/2021), Disp: 30 tablet, Rfl: 0 ?  ezetimibe (ZETIA) 10 MG tablet, Take 10 mg by mouth daily., Disp: , Rfl:  ?  ferrous sulfate 325 (65 FE) MG tablet, Take 325 mg by mouth daily. (Patient not taking: Reported on 02/08/2021), Disp: , Rfl: 6 ?  fexofenadine (ALLEGRA) 180 MG tablet, Take 180 mg by mouth daily., Disp: , Rfl:  ?  glimepiride (AMARYL) 2 MG tablet, Take 4 mg by mouth 2 (two) times daily. , Disp: , Rfl: 6 ?  losartan (COZAAR) 50 MG tablet, Take 50 mg by mouth daily., Disp: , Rfl:  ?  metFORMIN (GLUCOPHAGE) 500 MG tablet, Take 500 mg by mouth daily. (Patient not taking: Reported on 03/17/2021), Disp: , Rfl:  ?  metoprolol succinate (TOPROL-XL) 50 MG 24 hr  tablet, TAKE 1 TABLET BY MOUTH EVERY DAY--NEW DIRECTIONS, Disp: , Rfl: 1 ?  metoprolol tartrate (LOPRESSOR) 25 MG tablet, Take 0.5 tablets (12.5 mg total) by mouth 2 (two) times daily. Hold if sbp<100 or heart rate less than 50 (Patient not taking: Reported on 02/23/2017), Disp: 30 tablet, Rfl: 11 ?  metoprolol tartrate (LOPRESSOR) 25 MG tablet, Take 0.5 tablets (12.5 mg total) by mouth 2 (two) times daily. (Patient not taking: Reported on 02/23/2017), Disp: 30 tablet, Rfl: 0 ?  midodrine (PROAMATINE) 10 MG tablet, Take 1 tablet (10 mg total) by mouth 3 (three) times daily with meals. (Patient not taking: Reported on 02/23/2017), Disp: 90 tablet, Rfl: 0 ?  midodrine (PROAMATINE) 5 MG tablet, TAKE 1 TABLET BY MOUTH ONCE DAILY. NOTE DOSE DECREASE. (Patient not taking: Reported on 02/08/2021), Disp: , Rfl: 1 ?  nitroGLYCERIN (NITROSTAT) 0.4 MG SL tablet, Place 1 tablet (0.4 mg total) under the tongue every 5 (five) minutes as needed for chest pain. (Patient not taking: Reported on 10/05/2017), Disp: 10 tablet, Rfl: 12 ?  pantoprazole (PROTONIX) 40 MG tablet, Take 40 mg by mouth daily., Disp: , Rfl: 0 ?  rosuvastatin (CRESTOR) 40 MG tablet, Take 1 tablet (40 mg total) by mouth every evening., Disp: 30 tablet, Rfl: 0 ?  ticagrelor (  BRILINTA) 90 MG TABS tablet, Take 1 tablet (90 mg total) by mouth 2 (two) times daily., Disp: 60 tablet, Rfl: 2 ? ?Social History  ? ?Tobacco Use  ?Smoking Status Never  ?Smokeless Tobacco Never  ? ? ?Allergies  ?Allergen Reactions  ? Enalapril Maleate Itching  ? ?Objective:  ?There were no vitals filed for this visit. ?There is no height or weight on file to calculate BMI. ?Constitutional Well developed. ?Well nourished.  ?Vascular Dorsalis pedis pulses palpable bilaterally. ?Posterior tibial pulses palpable bilaterally. ?Capillary refill normal to all digits.  ?No cyanosis or clubbing noted. ?Pedal hair growth normal.  ?Neurologic Normal speech. ?Oriented to person, place, and time. ?Epicritic  sensation to light touch grossly present bilaterally.  ?Dermatologic Nails right hallux nail dystrophy with thickened elongated dystrophic with striation of the nail noted.  These findings are concerning to microtrauma as opposed to fungus. ?Skin within normal limits  ?Orthopedic: Normal joint ROM without pain or crepitus bilaterally. ?No visible deformities. ?No bony tenderness.  ? ?Radiographs: None ?Assessment:  ? ?1. Onychodystrophy   ? ?Plan:  ?Patient was evaluated and treated and all questions answered. ? ?Right hallux nail dystrophy/onychodystrophy ?-All questions and concerns were discussed with the patient in extensive detail.  I discussed with the patient that there is no underlying thickening noted to the nail just discoloration with signs of microtrauma to the nail.  There is striation across the nail with ridging.  I discussed this with patient she states understanding.  Unfortunately was not much that could be done further to the nail besides removing it.  I discussed with patient she would like to hold off for now we will think about it in the future. ? ?No follow-ups on file. ?

## 2021-10-02 ENCOUNTER — Encounter: Payer: Self-pay | Admitting: Dermatology

## 2021-10-02 ENCOUNTER — Ambulatory Visit (INDEPENDENT_AMBULATORY_CARE_PROVIDER_SITE_OTHER): Payer: BC Managed Care – PPO | Admitting: Dermatology

## 2021-10-02 DIAGNOSIS — L219 Seborrheic dermatitis, unspecified: Secondary | ICD-10-CM | POA: Diagnosis not present

## 2021-10-02 DIAGNOSIS — L304 Erythema intertrigo: Secondary | ICD-10-CM

## 2021-10-02 DIAGNOSIS — L853 Xerosis cutis: Secondary | ICD-10-CM

## 2021-10-02 MED ORDER — HYDROCORTISONE 2.5 % EX CREA: TOPICAL_CREAM | Freq: Two times a day (BID) | CUTANEOUS | 1 refills | Status: DC | PRN

## 2021-10-02 MED ORDER — KETOCONAZOLE 2 % EX CREA: 1.0000 | TOPICAL_CREAM | Freq: Two times a day (BID) | CUTANEOUS | 6 refills | Status: AC

## 2021-10-02 NOTE — Progress Notes (Signed)
New Patient Visit  Subjective  Jenna Boyer is a 56 y.o. female who presents for the following: New Patient (Initial Visit) ( Concerned may be breaking out after working out at gym. Dry spot at right side of nose and some scaly area at left upper eyelid. For a few weeks. Under breast when sweat kinda burns. ).   The following portions of the chart were reviewed this encounter and updated as appropriate:   Tobacco  Allergies  Meds  Problems  Med Hx  Surg Hx  Fam Hx      Review of Systems:  No other skin or systemic complaints except as noted in HPI or Assessment and Plan.  Objective  Well appearing patient in no apparent distress; mood and affect are within normal limits.  A focused examination was performed including face, chest, scalp. Relevant physical exam findings are noted in the Assessment and Plan.  Head - Anterior (Face) Right alar crease scaly erythematous patch, scaly erythematous patch left upper eyelid  Right Inframammary Fold Erythematous slightly macerated patches    Assessment & Plan  Seborrheic dermatitis Head - Anterior (Face)  Chronic and persistent condition with duration or expected duration over one year. Condition is bothersome/symptomatic for patient. Currently flared.  Seborrheic Dermatitis  -  is a chronic persistent rash characterized by pinkness and scaling most commonly of the mid face but also can occur on the scalp (dandruff), ears; mid chest, mid back and groin.  It tends to be exacerbated by stress and cooler weather.  People who have neurologic disease may experience new onset or exacerbation of existing seborrheic dermatitis.  The condition is not curable but treatable and can be controlled.  Start hydrocortisone cream use twice a day as needed for up 2 weeks  If keeps coming back switch to ketoconazole cream twice a day as needed or for prevention  Topical steroids (such as triamcinolone, fluocinolone, fluocinonide, mometasone,  clobetasol, halobetasol, betamethasone, hydrocortisone) can cause thinning and lightening of the skin if they are used for too long in the same area. Your physician has selected the right strength medicine for your problem and area affected on the body. Please use your medication only as directed by your physician to prevent side effects.    hydrocortisone 2.5 % cream - Head - Anterior (Face) Apply topically 2 (two) times daily as needed (Rash). Apply to aa nose ,left eyelid, and under breast. Can use for up to 2 weeks.  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 Condition is bothersome/symptomatic for patient. Currently flared.  Intertrigo is a chronic recurrent rash that occurs in skin fold areas that may be associated with friction; heat; moisture; yeast; fungus; and bacteria.  It is exacerbated by increased movement / activity; sweating; and higher atmospheric temperature.  Can use hydrocortisone 2.5 % cream apply twice a day as needed for up to 2 weeks.   Start Ketoconazole cream apply twice a day under breast as needed  Recommend drying really well after shower. Can use blow dryer.  Recommend Zeasorb AF Powder antifungal powder apply after showder for prevention  ketoconazole (NIZORAL) 2 % cream - Right Inframammary Fold Apply 1 Application topically 2 (two) times daily. To affected areas under breast and at face when flared with rash  Xerosis cutis Head - Anterior (Face)  Recommend gentle vanicream, cetaphil or cerave moisturizer daily to twice a day as needed to face  Recommended non-comedogenic (non-acne causing)  facial oils include 100% argan oil or squalane. The can be used after applying any recommended creams or ointments to the skin in the evening. The Ordinary Brand has a high-quality and affordable version of both of these and can be found at [place].   Return if symptoms  worsen or fail to improve.  I, Ruthell Rummage, CMA, am acting as scribe for Forest Gleason, MD.  Documentation: I have reviewed the above documentation for accuracy and completeness, and I agree with the above.  Forest Gleason, MD

## 2021-10-02 NOTE — Patient Instructions (Addendum)
For Daily Facial moisturizer recommend Cetaphil , Vanicream, Cerave   Or   Recommended non-comedogenic (non-acne causing) facial oils include 100% argan oil or squalane. The can be used after applying any recommended creams or ointments to the skin in the evening. The Ordinary Brand has a high-quality and affordable version of both of these and can be found at [place].  For dry patches at face   Start Hydrocortisone 2 % cream - apply topically to affected areas of face and under breast use twice daily. Can use for up to 2 weeks when flared.   Topical steroids (such as triamcinolone, fluocinolone, fluocinonide, mometasone, clobetasol, halobetasol, betamethasone, hydrocortisone) can cause thinning and lightening of the skin if they are used for too long in the same area. Your physician has selected the right strength medicine for your problem and area affected on the body. Please use your medication only as directed by your physician to prevent side effects.   For Rash under breast  Dry area well after shower  Can use Zeasorb Af Antifungal apply daily after shower to help control moisture   Start hydrocortisone cream - apply twice daily to affected areas for up to 2 weeks as needed.  Start ketoconzole cream - can apply twice daily to affected areas under breast and at face if dry patches do not go away after 2 weeks of using Hydrocortisone.    Some Recommended Sunscreens Include:   Good for Daily Wear (feels like lotion but NOT sweat resistant) (no mineral only) Cerave AM Moisturizer with SPF EltaMD UV Lotion  Body or All Over Sunscreen EltaMD UV active for body and face (not mineral only) Blue lizard sensitive or baby Sun bum mineral (avoid if sensitive to scent) Aveeno Positively Mineral Neutrogena sheer zinc (Slightly harder to rub in) [facility] clear zinc (Slightly harder to rub in)  Clear Face Sunscreen (not mineral only) EltaMD UV Elements CeraVe hydrating sunscreen  50 face  Tinted Face Sunscreen - mineral only Alastin Hydratint (good for most skin tones, may be slightly dark if you are very fair) Colorescience Sunforgettable Total Protection Face Shield (good for most skin tones) EltaMD UV Physical La Roche Posay Mineral Tinted Cotz Flawless Complexion   Powder Sunscreen (Nice for reapplying or applying on the go) Colorescience Sunforgettable Total Protection Brush on Shield (available in different tints)  Face Sunscreen Available in Different Tints Colorescience Sunforgettable Total Protection Brush on Shield  bareMinerals Complexion Rescue Tinted Hydrating Gel Cream Broad Spectrum SPF 30 UnSun mineral tinted (comes in medium/dark and light/medium)     Recommend taking Heliocare sun protection supplement daily in sunny weather for additional sun protection. For maximum protection on the sunniest days, you can take up to 2 capsules of regular Heliocare OR take 1 capsule of Heliocare Ultra. For prolonged exposure (such as a full day in the sun), you can repeat your dose of the supplement 4 hours after your first dose. Heliocare can be purchased at [employer], at some [facility] or at [email or web address].      Due to recent changes in healthcare laws, you may see results of your pathology and/or laboratory studies on MyChart before the doctors have had a chance to review them. We understand that in some cases there may be results that are confusing or concerning to you. Please understand that not all results are received at the same time and often the doctors may need to interpret multiple results in order to provide you with the best plan of  care or course of treatment. Therefore, we ask that you please give [place] 2 business days to thoroughly review all your results before contacting the office for clarification. Should we see a critical lab result, you will be contacted sooner.   If You Need Anything After Your Visit  If you have any questions  or concerns for your doctor, please call our main line at [phone] and press option 4 to reach your doctor's medical assistant. If no one answers, please leave a voicemail as directed and we will return your call as soon as possible. Messages left after 4 pm will be answered the following business day.   You may also send [place] a message via [place]. We typically respond to MyChart messages within 1-2 business days.  For prescription refills, please ask your pharmacy to contact our office. Our fax number is [phone].  If you have an urgent issue when the clinic is closed that cannot wait until the next business day, you can page your doctor at the number below.    Please note that while we do our best to be available for urgent issues outside of office hours, we are not available 24/7.   If you have an urgent issue and are unable to reach [place], you may choose to seek medical care at your doctor's office, retail clinic, urgent care center, or emergency room.  If you have a medical emergency, please immediately call 911 or go to the emergency department.  Pager Numbers  - Dr. Nehemiah Massed: [phone]  - Dr. Laurence Ferrari: [phone]  - Dr. Nicole Kindred: [phone]  In the event of inclement weather, please call our main line at [phone] for an update on the status of any delays or closures.  Dermatology Medication Tips: Please keep the boxes that topical medications come in in order to help keep track of the instructions about where and how to use these. Pharmacies typically print the medication instructions only on the boxes and not directly on the medication tubes.   If your medication is too expensive, please contact our office at [phone] option 4 or send [place] a message through [place].   We are unable to tell what your co-pay for medications will be in advance as this is different depending on your insurance coverage. However, we may be able to find a substitute medication at lower cost  or fill out paperwork to get insurance to cover a needed medication.   If a prior authorization is required to get your medication covered by your insurance company, please allow [place] 1-2 business days to complete this process.  Drug prices often vary depending on where the prescription is filled and some pharmacies may offer cheaper prices.  The website www.goodrx.com contains coupons for medications through different pharmacies. The prices here do not account for what the cost may be with help from insurance (it may be cheaper with your insurance), but the website can give you the price if you did not use any insurance.  - You can print the associated coupon and take it with your prescription to the pharmacy.  - You may also stop by our office during regular business hours and pick up a GoodRx coupon card.  - If you need your prescription sent electronically to a different pharmacy, notify our office through [facility] or by phone at [phone] option 4.     Si Usted Necesita Algo Despus de Su Visita  Tambin puede enviarnos un mensaje a travs de Pharmacist, community. Por lo general  respondemos a los mensajes de MyChart en el transcurso de 1 a 2 das hbiles.  Para renovar recetas, por favor pida a su farmacia que se ponga en contacto con nuestra oficina. Harland Dingwall de fax es [place] [phone].  Si tiene un asunto urgente cuando la clnica est cerrada y que no puede esperar hasta el siguiente da hbil, puede llamar/localizar a su doctor(a) al nmero que aparece a continuacin.   Por favor, tenga en cuenta que aunque hacemos todo lo posible para estar disponibles para asuntos urgentes fuera del horario de [place], no estamos disponibles las 24 horas del da, los 7 das de la [place].   Si tiene un problema urgente y no puede comunicarse con nosotros, puede optar por buscar atencin mdica  en el consultorio de su doctor(a), en una clnica privada, en un centro de atencin urgente o en una  sala de emergencias.  Si tiene Engineering geologist, por favor llame inmediatamente al 911 o vaya a la sala de emergencias.  Nmeros de bper  - Dr. Nehemiah Massed: [phone]  - Dra. Moye: [phone]  - Dra. Nicole Kindred: [phone]  En caso de inclemencias del [place], por favor llame a Johnsie Kindred principal al [phone] para una actualizacin sobre el [place] de cualquier retraso o cierre.  Consejos para la medicacin en dermatologa: Por favor, guarde las cajas en las que vienen los medicamentos de uso tpico para ayudarle a seguir las instrucciones sobre dnde y cmo usarlos. Las farmacias generalmente imprimen las instrucciones del medicamento slo en las cajas y no directamente en los tubos del [place].   Si su medicamento es muy caro, por favor, pngase en contacto con Zigmund Daniel llamando al [phone] y presione la opcin 4 o envenos un mensaje a travs de Pharmacist, community.   No podemos decirle cul ser su copago por los medicamentos por adelantado ya que esto es diferente dependiendo de la cobertura de su seguro. Sin embargo, es posible que podamos encontrar un medicamento sustituto a Electrical engineer un formulario para que el seguro cubra el medicamento que se considera necesario.   Si se requiere una autorizacin previa para que su compaa de seguros [place] su medicamento, por favor permtanos de 1 a 2 das hbiles para completar este proceso.  Los precios de los medicamentos varan con frecuencia dependiendo del Environmental consultant de dnde se surte la receta y alguna farmacias pueden ofrecer precios ms baratos.  El sitio web www.goodrx.com tiene cupones para medicamentos de Airline pilot. Los precios aqu no tienen en cuenta lo que podra costar con la ayuda del seguro (puede ser ms barato con su seguro), pero el sitio web puede darle el precio si no utiliz Research scientist (physical sciences).  - Puede imprimir el cupn correspondiente y llevarlo con su receta a la farmacia.  - Tambin puede  pasar por nuestra oficina durante el horario de atencin regular y Charity fundraiser una tarjeta de cupones de GoodRx.  - Si necesita que su receta se enve electrnicamente a una farmacia diferente, informe a nuestra oficina a travs de MyChart de [place] o por telfono llamando al [phone] y presione la opcin 4.

## 2022-02-10 ENCOUNTER — Other Ambulatory Visit: Payer: Self-pay

## 2022-02-10 DIAGNOSIS — Z1231 Encounter for screening mammogram for malignant neoplasm of breast: Secondary | ICD-10-CM

## 2022-03-13 ENCOUNTER — Ambulatory Visit
Admission: RE | Admit: 2022-03-13 | Discharge: 2022-03-13 | Disposition: A | Discharge status: Home / Self Care | Payer: BC Managed Care – PPO | Source: Ambulatory Visit | Attending: Internal Medicine | Admitting: Internal Medicine

## 2022-03-13 DIAGNOSIS — Z1231 Encounter for screening mammogram for malignant neoplasm of breast: Secondary | ICD-10-CM | POA: Diagnosis not present

## 2022-03-24 ENCOUNTER — Other Ambulatory Visit: Payer: Self-pay | Admitting: Internal Medicine

## 2022-03-25 ENCOUNTER — Ambulatory Visit: Payer: BC Managed Care – PPO | Admitting: Cardiovascular Disease

## 2022-05-08 ENCOUNTER — Other Ambulatory Visit: Payer: Self-pay | Admitting: Cardiovascular Disease

## 2022-05-08 DIAGNOSIS — E785 Hyperlipidemia, unspecified: Secondary | ICD-10-CM

## 2022-05-11 ENCOUNTER — Other Ambulatory Visit: Payer: Self-pay | Admitting: Internal Medicine

## 2022-05-27 ENCOUNTER — Ambulatory Visit: Payer: BC Managed Care – PPO | Admitting: Internal Medicine

## 2022-05-27 ENCOUNTER — Encounter: Payer: Self-pay | Admitting: Cardiovascular Disease

## 2022-05-27 ENCOUNTER — Ambulatory Visit (INDEPENDENT_AMBULATORY_CARE_PROVIDER_SITE_OTHER): Payer: BC Managed Care – PPO | Admitting: Cardiovascular Disease

## 2022-05-27 VITALS — BP 118/78 | HR 85 | Ht 61.0 in | Wt 138.8 lb

## 2022-05-27 DIAGNOSIS — I10 Essential (primary) hypertension: Secondary | ICD-10-CM

## 2022-05-27 DIAGNOSIS — E782 Mixed hyperlipidemia: Secondary | ICD-10-CM | POA: Diagnosis not present

## 2022-05-27 DIAGNOSIS — Z1389 Encounter for screening for other disorder: Secondary | ICD-10-CM

## 2022-05-27 DIAGNOSIS — I2510 Atherosclerotic heart disease of native coronary artery without angina pectoris: Secondary | ICD-10-CM

## 2022-05-27 DIAGNOSIS — Z1231 Encounter for screening mammogram for malignant neoplasm of breast: Secondary | ICD-10-CM

## 2022-05-27 LAB — POCT URINALYSIS DIPSTICK
Bilirubin, UA: NEGATIVE
Glucose, UA: NEGATIVE
Ketones, UA: NEGATIVE
Leukocytes, UA: NEGATIVE
Nitrite, UA: NEGATIVE
Protein, UA: POSITIVE — AB
Spec Grav, UA: 1.01 (ref 1.010–1.025)
Urobilinogen, UA: 0.2 E.U./dL
pH, UA: 7 (ref 5.0–8.0)

## 2022-05-27 MED ORDER — EZETIMIBE 10 MG PO TABS: 10.0000 mg | ORAL_TABLET | Freq: Every day | ORAL | 1 refills | Status: DC

## 2022-05-27 MED ORDER — TICAGRELOR 90 MG PO TABS: 90.0000 mg | ORAL_TABLET | Freq: Two times a day (BID) | ORAL | 1 refills | Status: DC

## 2022-05-27 NOTE — Assessment & Plan Note (Addendum)
Patient doing well. Denies chest pain, shortness of breath. CCTA 04/2020 RCA is normal, LAD has minor luminor irregularities, OMI stent has no restenosis. Continue Brilinta and aspirin.

## 2022-05-27 NOTE — Assessment & Plan Note (Signed)
Well controlled. Continue same medications. 

## 2022-05-27 NOTE — Progress Notes (Signed)
Pt informed

## 2022-05-27 NOTE — Assessment & Plan Note (Signed)
07/2021 LDL 778. Continue Zetia and rosuvastatin.

## 2022-05-27 NOTE — Progress Notes (Signed)
Cardiology Office Note   Date:  05/27/2022   ID:  Jenna Boyer, DOB [birth year], MRN [id number]  PCP:  Jenna Loveless, MD  Cardiologist:  Jenna Blackwater, MD      History of Present Illness: Jenna Boyer is a 57 y.o. female who presents for  Chief Complaint  Patient presents with   Follow-up    6 month follow up    Patient in office for routine cardiac exam. Denies chest pain, shortness of breath, edema, palpitations.     Past Medical History:  Diagnosis Date   CAD (coronary artery disease)    CHF (congestive heart failure) (HCC)    Depression    Diabetes mellitus without complication (HCC)    GERD (gastroesophageal reflux disease)    Hyperlipemia    Hypertension    Myocardial infarction [facility])      Past Surgical History:  Procedure Laterality Date   ABDOMINAL HYSTERECTOMY     APPENDECTOMY     COLONOSCOPY N/A 02/08/2021   Procedure: COLONOSCOPY;  Surgeon: Regis Bill, MD;  Location: [facility] ENDOSCOPY;  Service: Endoscopy;  Laterality: N/A;  DM, BLOOD THINNER   CORONARY ARTERY BYPASS GRAFT     CORONARY ULTRASOUND/IVUS N/A 01/25/2017   Procedure: Intravascular Ultrasound/IVUS;  Surgeon: Yvonne Kendall, MD;  Location: [facility] INVASIVE CV LAB;  Service: Cardiovascular;  Laterality: N/A;   CORONARY/GRAFT ACUTE MI REVASCULARIZATION N/A 01/25/2017   Procedure: Coronary/Graft Acute MI Revascularization;  Surgeon: Yvonne Kendall, MD;  Location: [facility] INVASIVE CV LAB;  Service: Cardiovascular;  Laterality: N/A;   LEFT HEART CATH AND CORONARY ANGIOGRAPHY N/A 01/25/2017   Procedure: LEFT HEART CATH AND CORONARY ANGIOGRAPHY;  Surgeon: Yvonne Kendall, MD;  Location: [facility] INVASIVE CV LAB;  Service: Cardiovascular;  Laterality: N/A;   MYOMECTOMY     OVARIAN CYST REMOVAL       Current Outpatient Medications  Medication Sig Dispense Refill   aspirin EC 81 MG tablet Take 81 mg by mouth daily.     fexofenadine (ALLEGRA) 180 MG tablet Take 180 mg by mouth daily.      glimepiride (AMARYL) 2 MG tablet Take 4 mg by mouth 2 (two) times daily.   6   hydrocortisone 2.5 % cream Apply topically 2 (two) times daily as needed (Rash). Apply to aa nose ,left eyelid, and under breast. Can use for up to 2 weeks. 30 g 1   losartan (COZAAR) 50 MG tablet TAKE 1 TABLET BY MOUTH EVERY DAY 90 tablet 3   metFORMIN (GLUCOPHAGE) 500 MG tablet Take 500 mg by mouth in the morning, at noon, and at bedtime.     metoprolol succinate (TOPROL-XL) 25 MG 24 hr tablet Take 1 tablet by mouth daily.     pantoprazole (PROTONIX) 40 MG tablet Take 40 mg by mouth daily.  0   rosuvastatin (CRESTOR) 40 MG tablet TAKE 1 TABLET BY MOUTH EVERY DAY 90 tablet 1   ezetimibe (ZETIA) 10 MG tablet Take 1 tablet (10 mg total) by mouth daily. 90 tablet 1   ticagrelor (BRILINTA) 90 MG TABS tablet Take 1 tablet (90 mg total) by mouth 2 (two) times daily. 180 tablet 1   No current facility-administered medications for this visit.    Allergies:   Enalapril maleate    Social History:   reports that she has never smoked. She has never used smokeless tobacco. She reports that she does not drink alcohol and does not use drugs.   Family History:  family history includes Breast cancer in her  maternal grandmother.    ROS:     Review of Systems  Constitutional: Negative.   HENT: Negative.    Eyes: Negative.   Respiratory: Negative.    Cardiovascular: Negative.   Gastrointestinal: Negative.   Genitourinary: Negative.   Musculoskeletal: Negative.   Skin: Negative.   Neurological: Negative.   Endo/Heme/Allergies: Negative.   Psychiatric/Behavioral: Negative.    All other systems reviewed and are negative.   All other systems are reviewed and negative.   PHYSICAL EXAM: VS:  BP 118/78   Pulse 85   Ht 5\' 1"  (1.549 m)   Wt 138 lb 12.8 oz (63 kg)   SpO2 97%   BMI 26.23 kg/m  , BMI Body mass index is 26.23 kg/m. Last weight:  Wt Readings from Last 3 Encounters:  05/27/22 138 lb 12.8 oz (63 kg)   03/17/21 136 lb (61.7 kg)  03/17/21 136 lb (61.7 kg)   Physical Exam Constitutional:      Appearance: Normal appearance.  Cardiovascular:     Rate and Rhythm: Normal rate and regular rhythm.     Heart sounds: Normal heart sounds.  Pulmonary:     Effort: Pulmonary effort is normal.     Breath sounds: Normal breath sounds.  Musculoskeletal:     Right lower leg: No edema.     Left lower leg: No edema.  Neurological:     Mental Status: She is alert.     EKG: none today  Recent Labs: No results found for requested labs within last 365 days.    Lipid Panel    Component Value Date/Time   CHOL 151 01/27/2017 0348   TRIG 89 01/27/2017 0348   HDL 50 01/27/2017 0348   CHOLHDL 3.0 01/27/2017 0348   VLDL 18 01/27/2017 0348   LDLCALC 83 01/27/2017 0348    Other studies Reviewed: Patient: [id number] - Jenna Boyer DOB:  [birth year]   Date:  07/09/2021 10:30 Provider: Adrian Blackwater MD Encounter: ECHO   Page 2 REASON FOR VISIT  Visit for: Echocardiogram/I34.0  Sex:   Female  wt=143    lbs.  BP=132/74  Height= 60   inches.   TESTS  Imaging: Echocardiogram:  An echocardiogram in (2-d) mode was performed and in Doppler mode with color flow velocity mapping was performed. The aortic valve cusps are abnormal 1.2   cm, flow velocity 1.23  m/s, and systolic calculated mean flow gradient 3   mmHg. Mitral valve diastolic peak flow velocity E .691   m/s and E/A ratio 0.8. Aortic root diameter 2.5  cm. The LVOT internal diameter 2.6  cm and flow velocity was abnormal 1.01   m/s. LV systolic dimension 2.18   cm, diastolic 3.93   cm, posterior wall thickness 1.17   cm, fractional shortening 44.5 %, and EF 76.5  %. IVS thickness 0.908   cm. LA dimension 3.9 cm. Mitral Valve has Mild Regurgitation. Aortic Valve has Mild Regurgitation. Pulmonic Valve has Trace Regurgitation. Tricuspid Valve has Trace Regurgitation.   ASSESSMENT  Technically adequate study.  Normal chamber sizes.   Normal left ventricular systolic function.  Mild left ventricular hypertrophy with GRADE 1 (relaxation abnormality) diastolic dysfunction.  Normal right ventricular systolic function.  Normal right ventricular diastolic function.  Normal left ventricular wall motion.  Normal right ventricular wall motion.  Trace pulmonary regurgitation.  Trace tricuspid regurgitation.  Normal pulmonary artery pressure.  Mild mitral regurgitation.  Mild aortic regurgitation.  No pericardial effusion.  Moderate to Severe LVH.   THERAPY  Referring physician: Laurier Nancy  Sonographer: Jenna Boyer.   Jenna Blackwater MD  Electronically signed by: Jenna Boyer     Date: 07/09/2021 14:43  Patient: [id number] - Silvia B. Bobby DOB:  02/08/1965   Date:  05/07/2020 09:30 Provider: Adrian Blackwater MD Encounter: ALL ANGIOGRAMS (CTA BRAIN, CAROTIDS, RENAL ARTERIES, PE)   Page 2 REASON FOR VISIT  Referred by Dr.Regine Christian [place] Flakes.    TESTS  Imaging: Computed Tomographic Angiography:  Cardiac multidetector CT was performed paying particular attention to the coronary arteries for the diagnosis of: CAD. Diagnostic Drugs:  Administered iohexol (Omnipaque) through an antecubital vein and images from the examination were analyzed for the presence and extent of coronary artery disease, using 3D image processing software. 100 mL of non-ionic contrast (Omnipaque) was used.   TEST CONCLUSIONS  Quality of study: Excellent  1-Calcium score: 606.4  2-Right domaninat system.  3-RCA is normal, LAD has minor luminor irregularities, OMI stent has no restenosis.  Jenna Blackwater MD  Electronically signed by: Jenna Boyer     Date: 05/18/2020 09:12  Patient: [id number] - Aliayah B. Hoselton DOB:  05/04/1965   Date:  04/26/2020 07:30 Provider: Adrian Blackwater MD Encounter: NUCLEAR STRESS TEST   Page 2 TESTS                                                                                          [facility] [address] [place], [place] [address] [phone] STUDY:  Gated Stress / Rest Myocardial Perfusion Imaging Tomographic (SPECT) Including attenuation correction Wall Motion, Left Ventricular Ejection Fraction By Gated Technique.Treadmill Stress Test. SEX: Female   WEIGHT: 134 lbs  HEIGHT: 61 in         ARMS UP: YES/NO                                                                        REFERRING PHYSICIAN: Dr.Zyrah Wiswell [place] Flakes  INDICATION FOR STUDY: SOB                                                                                                                                                                                                                    TECHNIQUE:  Approximately 20 minutes following the intravenous administration of 10.0 mCi of Tc-10m Sestamibi after stress testing in a reclined supine position with arms above their head if able to do so, gated SPECT imaging of the heart was performed. After about a 2hr break, the patient was injected intravenously with 31.6 mCi of Tc-8m Sestamibi.  Approximately 45 minutes later in the same position as stress imaging SPECT rest imaging of the heart was performed.  STRESS BY:  Jenna Blackwater, MD PROTOCOL:  Smitty Cords                                                                                        MAX PRED HR: 168                     85%: 143               75%: 126                                                                                                                   RESTING BP: 118/72  RESTING HR: 77  PEAK BP: 132/68   PEAK HR: 153 (91%)  EXERCISE DURATION:  7:49                                            METS:  9.8    REASON FOR TEST TERMINATION: Target  reached/1 min post injection                                                                                                                                  SYMPTOMS: None  [facility] TREADMILL SCORE: 8                                       RISK: Low                                                                                                                                                                                                            EKG RESULTS: NSR. 81/min. No significant ST changes at peak exercise.                                                              IMAGE QUALITY: Good  PERFUSION/WALL MOTION FINDINGS:  EF = Large severe fixed mid inferolateral, mid anterolateral, apical lateral, and apex (17) wall defects, normal wall motion.                                                                         IMPRESSION:  Infarction in the LCX territory with normal LVEF, advise CCTA.                                                                                                                                                                                                                                                                                       Jenna Blackwater, MD Stress Interpreting Physician / Nuclear Interpreting Physician        Jenna Blackwater MD  Electronically signed by: Jenna Boyer     Date: 04/30/2020 09:51   ASSESSMENT AND PLAN:    ICD-10-CM   1. Coronary artery disease involving native coronary artery of native heart without angina pectoris  I25.10     2. Primary hypertension  I10     3. Mixed hyperlipidemia  E78.2 ezetimibe (ZETIA) 10 MG tablet        Problem List Items Addressed This Visit       Cardiovascular and Mediastinum   HTN (hypertension)    Well controlled. Continue same medications.       Relevant Medications   ezetimibe (ZETIA) 10 MG tablet   Coronary artery disease involving native coronary artery of native heart without angina pectoris - Primary    Patient doing well. Denies chest pain, shortness of breath. CCTA 04/2020 RCA is normal, LAD has minor luminor irregularities, OMI stent has no restenosis. Continue Brilinta and aspirin.       Relevant Medications   ezetimibe (ZETIA) 10 MG tablet     Other   Hyperlipidemia, unspecified    07/2021 LDL 778. Continue Zetia and rosuvastatin.  Relevant Medications   ezetimibe (ZETIA) 10 MG tablet    Disposition:   Return in about 6 months (around 11/26/2022).    Total time spent: 30 minutes  Signed,  Jenna Blackwater, MD  05/27/2022 9:23 AM    [facility]

## 2022-05-28 LAB — MICROSCOPIC EXAMINATION
Bacteria, UA: NONE SEEN
Casts: NONE SEEN /lpf
RBC, Urine: NONE SEEN /hpf (ref 0–2)
WBC, UA: NONE SEEN /hpf (ref 0–5)

## 2022-05-28 LAB — URINALYSIS, COMPLETE
Bilirubin, UA: NEGATIVE
Glucose, UA: NEGATIVE
Ketones, UA: NEGATIVE
Leukocytes,UA: NEGATIVE
Nitrite, UA: NEGATIVE
RBC, UA: NEGATIVE
Specific Gravity, UA: 1.007 (ref 1.005–1.030)
Urobilinogen, Ur: 0.2 mg/dL (ref 0.2–1.0)
pH, UA: 7.5 (ref 5.0–7.5)

## 2022-05-29 LAB — URINE CULTURE

## 2022-05-30 ENCOUNTER — Telehealth: Payer: Self-pay | Admitting: Internal Medicine

## 2022-05-30 NOTE — Telephone Encounter (Signed)
Patient returned call and said that her UTI symptoms are better since she passed an unexpected kidney stone.

## 2022-06-13 ENCOUNTER — Other Ambulatory Visit: Payer: Self-pay

## 2022-06-13 ENCOUNTER — Encounter: Payer: Self-pay | Admitting: Cardiovascular Disease

## 2022-06-13 MED ORDER — TICAGRELOR 60 MG PO TABS: 60.0000 mg | ORAL_TABLET | Freq: Two times a day (BID) | ORAL | 3 refills | Status: DC

## 2022-07-01 ENCOUNTER — Other Ambulatory Visit: Payer: Self-pay | Admitting: Cardiovascular Disease

## 2022-08-07 ENCOUNTER — Ambulatory Visit (INDEPENDENT_AMBULATORY_CARE_PROVIDER_SITE_OTHER): Payer: BC Managed Care – PPO | Admitting: Internal Medicine

## 2022-08-07 ENCOUNTER — Encounter: Payer: Self-pay | Admitting: Internal Medicine

## 2022-08-07 VITALS — BP 118/66 | HR 74 | Ht 61.0 in | Wt 137.4 lb

## 2022-08-07 DIAGNOSIS — I10 Essential (primary) hypertension: Secondary | ICD-10-CM

## 2022-08-07 DIAGNOSIS — I2510 Atherosclerotic heart disease of native coronary artery without angina pectoris: Secondary | ICD-10-CM | POA: Diagnosis not present

## 2022-08-07 DIAGNOSIS — E782 Mixed hyperlipidemia: Secondary | ICD-10-CM | POA: Diagnosis not present

## 2022-08-07 DIAGNOSIS — E119 Type 2 diabetes mellitus without complications: Secondary | ICD-10-CM

## 2022-08-07 LAB — POCT CBG (FASTING - GLUCOSE)-MANUAL ENTRY: Glucose Fasting, POC: 127 mg/dL — AB (ref 70–99)

## 2022-08-07 NOTE — Progress Notes (Signed)
Established Patient Office Visit  Subjective:  Patient ID: Jenna Boyer, female    DOB: 15-Jul-1965  Age: 57 y.o. MRN: [id number]  Chief Complaint  Patient presents with   Follow-up    6 month follow up    Patient comes in for follow-up today.  She has been doing well.  She has been under the care of cardiology as well as endocrinology.  Her most recent hemoglobin A1c has been looking much better than before.  Patient has no new complaints. She recently had COVID infection but  the symptoms were mild.  Will get a new vaccine this season once available.    No other concerns at this time.   Past Medical History:  Diagnosis Date   CAD (coronary artery disease)    CHF (congestive heart failure) (HCC)    Depression    Diabetes mellitus without complication (HCC)    GERD (gastroesophageal reflux disease)    Hyperlipemia    Hypertension    Myocardial infarction [facility])     Past Surgical History:  Procedure Laterality Date   ABDOMINAL HYSTERECTOMY     APPENDECTOMY     COLONOSCOPY N/A 02/08/2021   Procedure: COLONOSCOPY;  Surgeon: Regis Bill, MD;  Location: [facility] ENDOSCOPY;  Service: Endoscopy;  Laterality: N/A;  DM, BLOOD THINNER   CORONARY ARTERY BYPASS GRAFT     CORONARY ULTRASOUND/IVUS N/A 01/25/2017   Procedure: Intravascular Ultrasound/IVUS;  Surgeon: Yvonne Kendall, MD;  Location: [facility] INVASIVE CV LAB;  Service: Cardiovascular;  Laterality: N/A;   CORONARY/GRAFT ACUTE MI REVASCULARIZATION N/A 01/25/2017   Procedure: Coronary/Graft Acute MI Revascularization;  Surgeon: Yvonne Kendall, MD;  Location: [facility] INVASIVE CV LAB;  Service: Cardiovascular;  Laterality: N/A;   LEFT HEART CATH AND CORONARY ANGIOGRAPHY N/A 01/25/2017   Procedure: LEFT HEART CATH AND CORONARY ANGIOGRAPHY;  Surgeon: Yvonne Kendall, MD;  Location: [facility] INVASIVE CV LAB;  Service: Cardiovascular;  Laterality: N/A;   MYOMECTOMY     OVARIAN CYST REMOVAL      Social History   Socioeconomic  History   Marital status: Widowed    Spouse name: Not on file   Number of children: Not on file   Years of education: Not on file   Highest education level: Not on file  Occupational History   Not on file  Tobacco Use   Smoking status: Never   Smokeless tobacco: Never  Vaping Use   Vaping status: Never Used  Substance and Sexual Activity   Alcohol use: No   Drug use: No   Sexual activity: Not on file  Other Topics Concern   Not on file  Social History Narrative   Not on file   Social Determinants of Health   Financial Resource Strain: Not on file  Food Insecurity: Not on file  Transportation Needs: Not on file  Physical Activity: Not on file  Stress: Not on file  Social Connections: Not on file  Intimate Partner Violence: Not on file    Family History  Problem Relation Age of Onset   Breast cancer Maternal Grandmother     Allergies  Allergen Reactions   Enalapril Maleate Itching    Review of Systems  Constitutional: Negative.   HENT: Negative.    Eyes: Negative.   Respiratory: Negative.  Negative for cough and shortness of breath.   Cardiovascular: Negative.  Negative for chest pain, palpitations and leg swelling.  Gastrointestinal: Negative.  Negative for abdominal pain, constipation, diarrhea, heartburn, nausea and vomiting.  Genitourinary: Negative.  Negative for  dysuria and flank pain.  Musculoskeletal: Negative.  Negative for joint pain and myalgias.  Skin: Negative.   Neurological: Negative.  Negative for dizziness and headaches.  Endo/Heme/Allergies: Negative.   Psychiatric/Behavioral: Negative.  Negative for depression and suicidal ideas. The patient is not nervous/anxious.        Objective:   BP 118/66   Pulse 74   Ht 5\' 1"  (1.549 m)   Wt 137 lb 6.4 oz (62.3 kg)   SpO2 97%   BMI 25.96 kg/m   Vitals:   08/07/22 0915  BP: 118/66  Pulse: 74  Height: 5\' 1"  (1.549 m)  Weight: 137 lb 6.4 oz (62.3 kg)  SpO2: 97%  BMI (Calculated): 25.97     Physical Exam Vitals and nursing note reviewed.  Constitutional:      Appearance: Normal appearance.  HENT:     Head: Normocephalic and atraumatic.     Nose: Nose normal.     Mouth/Throat:     Mouth: Mucous membranes are moist.     Pharynx: Oropharynx is clear.  Eyes:     Conjunctiva/sclera: Conjunctivae normal.     Pupils: Pupils are equal, round, and reactive to light.  Cardiovascular:     Rate and Rhythm: Normal rate and regular rhythm.     Pulses: Normal pulses.     Heart sounds: Murmur heard.  Pulmonary:     Effort: Pulmonary effort is normal.     Breath sounds: Normal breath sounds. No wheezing.  Abdominal:     General: Bowel sounds are normal.     Palpations: Abdomen is soft. There is no mass.     Tenderness: There is no abdominal tenderness. There is no right CVA tenderness, left CVA tenderness or guarding.     Hernia: No hernia is present.  Musculoskeletal:        General: Normal range of motion.     Cervical back: Normal range of motion.     Right lower leg: No edema.     Left lower leg: No edema.  Skin:    General: Skin is warm and dry.  Neurological:     General: No focal deficit present.     Mental Status: She is alert and oriented to person, place, and time.  Psychiatric:        Mood and Affect: Mood normal.        Behavior: Behavior normal.      Results for orders placed or performed in visit on 08/07/22  POCT CBG (Fasting - Glucose)  Result Value Ref Range   Glucose Fasting, POC 127 (A) 70 - 99 mg/dL        Assessment & Plan:  Patient will continue taking all her meds. Labs are done at the endocrinologist. Problem List Items Addressed This Visit     Essential hypertension, benign   Hyperlipidemia, unspecified   Coronary artery disease involving native coronary artery of native heart without angina pectoris   Type 2 diabetes mellitus without complication, without long-term current use of insulin (HCC) - Primary   Relevant Orders   POCT  CBG (Fasting - Glucose) (Completed)    Return today (on 08/07/2022).   Total time spent: 25 minutes  Margaretann Loveless, MD  08/07/2022   This document may have been prepared by [facility] Voice Recognition software and as such may include unintentional dictation errors.

## 2022-09-17 ENCOUNTER — Other Ambulatory Visit: Payer: Self-pay | Admitting: Cardiovascular Disease

## 2022-11-25 ENCOUNTER — Encounter: Payer: Self-pay | Admitting: Cardiology

## 2022-11-25 ENCOUNTER — Ambulatory Visit (INDEPENDENT_AMBULATORY_CARE_PROVIDER_SITE_OTHER): Payer: BC Managed Care – PPO | Admitting: Cardiology

## 2022-11-25 VITALS — BP 116/76 | HR 70 | Ht 61.0 in | Wt 139.4 lb

## 2022-11-25 DIAGNOSIS — I10 Essential (primary) hypertension: Secondary | ICD-10-CM

## 2022-11-25 DIAGNOSIS — I2510 Atherosclerotic heart disease of native coronary artery without angina pectoris: Secondary | ICD-10-CM | POA: Diagnosis not present

## 2022-11-25 DIAGNOSIS — E782 Mixed hyperlipidemia: Secondary | ICD-10-CM

## 2022-11-25 MED ORDER — ROSUVASTATIN CALCIUM 40 MG PO TABS: 40.0000 mg | ORAL_TABLET | Freq: Every day | ORAL | 3 refills | Status: DC

## 2022-11-25 NOTE — Progress Notes (Signed)
Cardiology Office Note   Date:  11/25/2022   ID:  Jenna Boyer, DOB Oct 24, 1965, MRN [id number]  PCP:  Jenna Loveless, MD  Cardiologist:  Marisue Ivan, NP      History of Present Illness: Jenna Boyer is a 57 y.o. female who presents for  Chief Complaint  Patient presents with   Follow-up    6 month follow up    Patient in office for routine cardiac exam. Denies chest pain, shortness of breath, dizziness, lower extremity edema.       Past Medical History:  Diagnosis Date   CAD (coronary artery disease)    CHF (congestive heart failure) (HCC)    Depression    Diabetes mellitus without complication (HCC)    GERD (gastroesophageal reflux disease)    Hyperlipemia    Hypertension    Myocardial infarction [facility])      Past Surgical History:  Procedure Laterality Date   ABDOMINAL HYSTERECTOMY     APPENDECTOMY     COLONOSCOPY N/A 02/08/2021   Procedure: COLONOSCOPY;  Surgeon: Jenna Bill, MD;  Location: [facility] ENDOSCOPY;  Service: Endoscopy;  Laterality: N/A;  DM, BLOOD THINNER   CORONARY ARTERY BYPASS GRAFT     CORONARY ULTRASOUND/IVUS N/A 01/25/2017   Procedure: Intravascular Ultrasound/IVUS;  Surgeon: Jenna Kendall, MD;  Location: [facility] INV[facility] CV [facility];  Service: Cardiovascular;  Laterality: N/A;   CORONARY/GRAFT ACUTE MI REVASCULARIZATION N/A 01/25/2017   Procedure: Coronary/Graft Acute MI Revascularization;  Surgeon: Jenna Kendall, MD;  Location: [facility] INV[facility] CV [facility];  Service: Cardiovascular;  Laterality: N/A;   LEFT HEART CATH AND CORONARY ANGIOGRAPHY N/A 01/25/2017   Procedure: LEFT HEART CATH AND CORONARY ANGIOGRAPHY;  Surgeon: Jenna Kendall, MD;  Location: [facility] INV[facility] CV [facility];  Service: Cardiovascular;  Laterality: N/A;   MYOMECTOMY     OVARIAN CYST REMOVAL       Current Outpatient Medications  Medication Sig Dispense Refill   aspirin EC 81 MG tablet Take 81 mg by mouth daily.     cholecalciferol (VITAMIN D3) 25 MCG (1000 UNIT) tablet  Take 1,000 Units by mouth daily.     ezetimibe (ZETIA) 10 MG tablet Take 1 tablet (10 mg total) by mouth daily. 90 tablet 1   fexofenadine (ALLEGRA) 180 MG tablet Take 180 mg by mouth daily.     glimepiride (AMARYL) 2 MG tablet Take 4 mg by mouth 2 (two) times daily.   6   losartan (COZAAR) 50 MG tablet TAKE 1 TABLET BY MOUTH EVERY DAY 90 tablet 3   magnesium gluconate (MAGONATE) 500 MG tablet Take 500 mg by mouth 2 (two) times daily.     metFORMIN (GLUCOPHAGE) 500 MG tablet Take 500 mg by mouth in the morning, at noon, and at bedtime.     metoprolol succinate (TOPROL-XL) 25 MG 24 hr tablet Take 1 tablet by mouth daily.     pantoprazole (PROTONIX) 40 MG tablet Take 40 mg by mouth daily.  0   ticagrelor (BRILINTA) 60 MG TABS tablet TAKE 1 TABLET BY MOUTH TWICE DAILY 120 tablet 3   hydrocortisone 2.5 % cream Apply topically 2 (two) times daily as needed (Rash). Apply to aa nose ,left eyelid, and under breast. Can use for up to 2 weeks. (Patient not taking: Reported on 08/07/2022) 30 g 1   rosuvastatin (CRESTOR) 40 MG tablet Take 1 tablet (40 mg total) by mouth daily. 90 tablet 3   No current facility-administered medications for this visit.    Allergies:   Enalapril maleate  Social History:   reports that she has never smoked. She has never used smokeless tobacco. She reports that she does not drink alcohol and does not use drugs.   Family History:  family history includes Breast cancer in her maternal grandmother.    ROS:     Review of Systems  Constitutional: Negative.   HENT: Negative.    Eyes: Negative.   Respiratory: Negative.    Cardiovascular: Negative.   Gastrointestinal: Negative.   Genitourinary: Negative.   Musculoskeletal: Negative.   Skin: Negative.   Neurological: Negative.   Endo/Heme/Allergies: Negative.   Psychiatric/Behavioral: Negative.    All other systems reviewed and are negative.     All other systems are reviewed and negative.    PHYSICAL EXAM: VS:   BP 116/76   Pulse 70   Ht 5\' 1"  (1.549 m)   Wt 139 lb 6.4 oz (63.2 kg)   SpO2 99%   BMI 26.34 kg/m  , BMI Body mass index is 26.34 kg/m. Last weight:  Wt Readings from Last 3 Encounters:  11/25/22 139 lb 6.4 oz (63.2 kg)  08/07/22 137 lb 6.4 oz (62.3 kg)  05/27/22 138 lb 12.8 oz (63 kg)     Physical Exam Vitals and nursing note reviewed.  Constitutional:      Appearance: Normal appearance. She is normal weight.  HENT:     Head: Normocephalic and atraumatic.     Nose: Nose normal.     Mouth/Throat:     Mouth: Mucous membranes are moist.     Pharynx: Oropharynx is clear.  Eyes:     Conjunctiva/sclera: Conjunctivae normal.     Pupils: Pupils are equal, round, and reactive to light.  Cardiovascular:     Rate and Rhythm: Normal rate and regular rhythm.     Pulses: Normal pulses.     Heart sounds: Normal heart sounds.  Pulmonary:     Effort: Pulmonary effort is normal.     Breath sounds: Normal breath sounds.  Abdominal:     General: Abdomen is flat. Bowel sounds are normal.     Palpations: Abdomen is soft.  Musculoskeletal:        General: Normal range of motion.     Cervical back: Normal range of motion.  Skin:    General: Skin is warm and dry.  Neurological:     General: No focal deficit present.     Mental Status: She is alert and oriented to person, place, and time. Mental status is at baseline.  Psychiatric:        Mood and Affect: Mood normal.        Behavior: Behavior normal.      EKG: none today  Recent Labs: No results found for requested labs within last 365 days.    Lipid Panel    Component Value Date/Time   CHOL 151 01/27/2017 0348   TRIG 89 01/27/2017 0348   HDL 50 01/27/2017 0348   CHOLHDL 3.0 01/27/2017 0348   VLDL 18 01/27/2017 0348   LDLCALC 83 01/27/2017 0348     ASSESSMENT AND PLAN:    ICD-10-CM   1. Coronary artery disease involving native coronary artery of native heart without angina pectoris  I25.10     2. Essential  hypertension, benign  I10     3. Mixed hyperlipidemia  E78.2        Problem List Items Addressed This Visit       Cardiovascular and Mediastinum   Essential hypertension, benign  Well controlled. Continue same medications.       Relevant Medications   rosuvastatin (CRESTOR) 40 MG tablet   Coronary artery disease involving native coronary artery of native heart without angina pectoris - Primary    Patient doing well. Denies chest pain, shortness of breath. Exercises three days a week at the gym with no problems. CCTA 04/2020 RCA is normal, LAD has minor luminor irregularities, OMI stent has no restenosis. Continue Brilinta and aspirin.       Relevant Medications   rosuvastatin (CRESTOR) 40 MG tablet     Other   Hyperlipidemia, unspecified    07/2021 LDL 778. Continue Zetia and rosuvastatin.       Relevant Medications   rosuvastatin (CRESTOR) 40 MG tablet     Disposition:   Return in about 6 months (around 05/26/2023) for cardiology.    Total time spent: 25 minutes  Signed,  [employer], NP  11/25/2022 9:20 AM    [facility]

## 2022-11-25 NOTE — Assessment & Plan Note (Signed)
Well controlled. Continue same medications. 

## 2022-11-25 NOTE — Assessment & Plan Note (Addendum)
Patient doing well. Denies chest pain, shortness of breath. Exercises three days a week at the gym with no problems. CCTA 04/2020 RCA is normal, LAD has minor luminor irregularities, OMI stent has no restenosis. Continue Brilinta and aspirin.

## 2022-11-25 NOTE — Assessment & Plan Note (Signed)
07/2021 LDL 778. Continue Zetia and rosuvastatin.

## 2023-01-27 ENCOUNTER — Other Ambulatory Visit: Payer: Self-pay | Admitting: Internal Medicine

## 2023-02-10 ENCOUNTER — Other Ambulatory Visit: Payer: Self-pay | Admitting: Internal Medicine

## 2023-02-10 DIAGNOSIS — Z1231 Encounter for screening mammogram for malignant neoplasm of breast: Secondary | ICD-10-CM

## 2023-03-17 ENCOUNTER — Ambulatory Visit
Admission: RE | Admit: 2023-03-17 | Discharge: 2023-03-17 | Disposition: A | Discharge status: Home / Self Care | Payer: BC Managed Care – PPO | Source: Ambulatory Visit | Attending: Internal Medicine | Admitting: Internal Medicine

## 2023-03-17 DIAGNOSIS — Z1231 Encounter for screening mammogram for malignant neoplasm of breast: Secondary | ICD-10-CM | POA: Diagnosis present

## 2023-03-28 ENCOUNTER — Other Ambulatory Visit: Payer: Self-pay | Admitting: Family

## 2023-05-26 ENCOUNTER — Encounter: Payer: Self-pay | Admitting: Cardiovascular Disease

## 2023-05-26 ENCOUNTER — Ambulatory Visit (INDEPENDENT_AMBULATORY_CARE_PROVIDER_SITE_OTHER): Payer: BC Managed Care – PPO | Admitting: Cardiovascular Disease

## 2023-05-26 VITALS — BP 110/70 | HR 82 | Ht 61.0 in | Wt 139.6 lb

## 2023-05-26 DIAGNOSIS — E119 Type 2 diabetes mellitus without complications: Secondary | ICD-10-CM

## 2023-05-26 DIAGNOSIS — I10 Essential (primary) hypertension: Secondary | ICD-10-CM | POA: Diagnosis not present

## 2023-05-26 DIAGNOSIS — Z955 Presence of coronary angioplasty implant and graft: Secondary | ICD-10-CM

## 2023-05-26 DIAGNOSIS — E782 Mixed hyperlipidemia: Secondary | ICD-10-CM | POA: Diagnosis not present

## 2023-05-26 DIAGNOSIS — I2510 Atherosclerotic heart disease of native coronary artery without angina pectoris: Secondary | ICD-10-CM | POA: Diagnosis not present

## 2023-05-26 MED ORDER — CLOPIDOGREL BISULFATE 75 MG PO TABS: 75.0000 mg | ORAL_TABLET | Freq: Every day | ORAL | 11 refills | Status: AC

## 2023-05-26 NOTE — Progress Notes (Signed)
 Cardiology Office Note   Date:  05/26/2023   ID:  Jenna Boyer, DOB Aug 02, 1965, MRN [id number]  PCP:  Aisha Hove, MD  Cardiologist:  Debborah Fairly, MD      History of Present Illness: Jenna Boyer is a 58 y.o. female who presents for  Chief Complaint  Patient presents with   Follow-up    6 month follow  up    Doing well, no chest pain or SOB      Past Medical History:  Diagnosis Date   CAD (coronary artery disease)    CHF (congestive heart failure) (HCC)    Depression    Diabetes mellitus without complication (HCC)    GERD (gastroesophageal reflux disease)    Hyperlipemia    Hypertension    Myocardial infarction [facility])      Past Surgical History:  Procedure Laterality Date   ABDOMINAL HYSTERECTOMY     APPENDECTOMY     COLONOSCOPY N/A 02/08/2021   Procedure: COLONOSCOPY;  Surgeon: Shane Darling, MD;  Location: [facility] ENDOSCOPY;  Service: Endoscopy;  Laterality: N/A;  DM, BLOOD THINNER   CORONARY ARTERY BYPASS GRAFT     CORONARY ULTRASOUND/IVUS N/A 01/25/2017   Procedure: Intravascular Ultrasound/IVUS;  Surgeon: Sammy Crisp, MD;  Location: [facility];  Service: Cardiovascular;  Laterality: N/A;   CORONARY/GRAFT ACUTE MI REVASCULARIZATION N/A 01/25/2017   Procedure: Coronary/Graft Acute MI Revascularization;  Surgeon: Sammy Crisp, MD;  Location: [facility];  Service: Cardiovascular;  Laterality: N/A;   LEFT HEART CATH AND CORONARY ANGIOGRAPHY N/A 01/25/2017   Procedure: LEFT HEART CATH AND CORONARY ANGIOGRAPHY;  Surgeon: Sammy Crisp, MD;  Location: [facility] INV[facility] CV [facility];  Service: Cardiovascular;  Laterality: N/A;   MYOMECTOMY     OVARIAN CYST REMOVAL       Current Outpatient Medications  Medication Sig Dispense Refill   aspirin  EC 81 MG tablet Take 81 mg by mouth daily.     cholecalciferol (VITAMIN D3) 25 MCG (1000 UNIT) tablet Take 1,000 Units by mouth daily.     clopidogrel (PLAVIX) 75 MG tablet Take 1 tablet (75  mg total) by mouth daily. 30 tablet 11   ezetimibe  (ZETIA ) 10 MG tablet Take 1 tablet (10 mg total) by mouth daily. 90 tablet 1   fexofenadine (ALLEGRA) 180 MG tablet Take 180 mg by mouth daily.     glimepiride (AMARYL) 2 MG tablet Take 4 mg by mouth 2 (two) times daily.   6   losartan  (COZAAR ) 50 MG tablet TAKE 1 TABLET BY MOUTH EVERY DAY 90 tablet 3   magnesium  gluconate (MAGONATE) 500 MG tablet Take 500 mg by mouth 2 (two) times daily.     metFORMIN (GLUCOPHAGE) 500 MG tablet Take 500 mg by mouth in the morning, at noon, and at bedtime.     metoprolol  succinate (TOPROL -XL) 25 MG 24 hr tablet TAKE 1 TABLET BY MOUTH EVERY DAY 90 tablet 3   pantoprazole  (PROTONIX ) 40 MG tablet Take 40 mg by mouth daily.  0   rosuvastatin  (CRESTOR ) 40 MG tablet TAKE 1 TABLET BY MOUTH EVERY DAY 90 tablet 3   No current facility-administered medications for this visit.    Allergies:   Enalapril  maleate    Social History:   reports that she has never smoked. She has never used smokeless tobacco. She reports that she does not drink alcohol and does not use drugs.   Family History:  family history includes Breast cancer in her maternal grandmother.  ROS:     Review of Systems  Constitutional: Negative.   HENT: Negative.    Eyes: Negative.   Respiratory: Negative.    Gastrointestinal: Negative.   Genitourinary: Negative.   Musculoskeletal: Negative.   Skin: Negative.   Neurological: Negative.   Endo/Heme/Allergies: Negative.   Psychiatric/Behavioral: Negative.    All other systems reviewed and are negative.     All other systems are reviewed and negative.    PHYSICAL EXAM: VS:  BP 110/70   Pulse 82   Ht 5\' 1"  (1.549 m)   Wt 139 lb 9.6 oz (63.3 kg)   SpO2 92%   BMI 26.38 kg/m  , BMI Body mass index is 26.38 kg/m. Last weight:  Wt Readings from Last 3 Encounters:  05/26/23 139 lb 9.6 oz (63.3 kg)  11/25/22 139 lb 6.4 oz (63.2 kg)  08/07/22 137 lb 6.4 oz (62.3 kg)     Physical  Exam Constitutional:      Appearance: Normal appearance.  Cardiovascular:     Rate and Rhythm: Normal rate and regular rhythm.     Heart sounds: Normal heart sounds.  Pulmonary:     Effort: Pulmonary effort is normal.     Breath sounds: Normal breath sounds.  Musculoskeletal:     Right lower leg: No edema.     Left lower leg: No edema.  Neurological:     Mental Status: She is alert.       EKG:   Recent Labs: No results found for requested labs within last 365 days.    Lipid Panel    Component Value Date/Time   CHOL 151 01/27/2017 0348   TRIG 89 01/27/2017 0348   HDL 50 01/27/2017 0348   CHOLHDL 3.0 01/27/2017 0348   VLDL 18 01/27/2017 0348   LDLCALC 83 01/27/2017 0348      Other studies Reviewed: Additional studies/ records that were reviewed today include:  Review of the above records demonstrates:       No data to display            ASSESSMENT AND PLAN:    ICD-10-CM   1. Mixed hyperlipidemia  E78.2 PCV ECHOCARDIOGRAM COMPLETE    MYOCARDIAL PERFUSION IMAGING    clopidogrel (PLAVIX) 75 MG tablet    2. Essential hypertension, benign  I10 PCV ECHOCARDIOGRAM COMPLETE    MYOCARDIAL PERFUSION IMAGING    clopidogrel (PLAVIX) 75 MG tablet    3. Coronary artery disease involving native coronary artery of native heart without angina pectoris  I25.10 PCV ECHOCARDIOGRAM COMPLETE    MYOCARDIAL PERFUSION IMAGING    clopidogrel (PLAVIX) 75 MG tablet   chaange brillanta to palvix as tto expensive    4. Primary hypertension  I10 PCV ECHOCARDIOGRAM COMPLETE    MYOCARDIAL PERFUSION IMAGING    clopidogrel (PLAVIX) 75 MG tablet    5. Type 2 diabetes mellitus without complication, without long-term current use of insulin  (HCC)  E11.9 PCV ECHOCARDIOGRAM COMPLETE    MYOCARDIAL PERFUSION IMAGING    clopidogrel (PLAVIX) 75 MG tablet    6. Coronary stent patent  Z95.5 PCV ECHOCARDIOGRAM COMPLETE    MYOCARDIAL PERFUSION IMAGING    clopidogrel (PLAVIX) 75 MG tablet    In 2018 OM1 stenting , needs f/u stress test and echo       Problem List Items Addressed This Visit       Cardiovascular and Mediastinum   Essential hypertension, benign   Relevant Medications   clopidogrel (PLAVIX) 75 MG tablet   Other Relevant  Orders   PCV ECHOCARDIOGRAM COMPLETE   MYOCARDIAL PERFUSION IMAGING   Coronary artery disease involving native coronary artery of native heart without angina pectoris   Relevant Medications   clopidogrel (PLAVIX) 75 MG tablet   Other Relevant Orders   PCV ECHOCARDIOGRAM COMPLETE   MYOCARDIAL PERFUSION IMAGING     Endocrine   Type 2 diabetes mellitus without complication, without long-term current use of insulin  (HCC)   Relevant Medications   clopidogrel (PLAVIX) 75 MG tablet   Other Relevant Orders   PCV ECHOCARDIOGRAM COMPLETE   MYOCARDIAL PERFUSION IMAGING     Other   Hyperlipidemia, unspecified - Primary   Relevant Medications   clopidogrel (PLAVIX) 75 MG tablet   Other Relevant Orders   PCV ECHOCARDIOGRAM COMPLETE   MYOCARDIAL PERFUSION IMAGING   Other Visit Diagnoses       Primary hypertension       Relevant Medications   clopidogrel (PLAVIX) 75 MG tablet   Other Relevant Orders   PCV ECHOCARDIOGRAM COMPLETE   MYOCARDIAL PERFUSION IMAGING     Coronary stent patent       In 2018 OM1 stenting , needs f/u stress test and echo   Relevant Medications   clopidogrel (PLAVIX) 75 MG tablet   Other Relevant Orders   PCV ECHOCARDIOGRAM COMPLETE   MYOCARDIAL PERFUSION IMAGING          Disposition:   Return in about 4 weeks (around 06/23/2023) for echo, stress test and f/u.    Total time spent: 30 minutes  Signed,  Debborah Fairly, MD  05/26/2023 9:28 AM    [facility]

## 2023-05-29 ENCOUNTER — Ambulatory Visit (INDEPENDENT_AMBULATORY_CARE_PROVIDER_SITE_OTHER)

## 2023-05-29 DIAGNOSIS — I2510 Atherosclerotic heart disease of native coronary artery without angina pectoris: Secondary | ICD-10-CM

## 2023-05-29 DIAGNOSIS — E119 Type 2 diabetes mellitus without complications: Secondary | ICD-10-CM

## 2023-05-29 DIAGNOSIS — I371 Nonrheumatic pulmonary valve insufficiency: Secondary | ICD-10-CM | POA: Diagnosis not present

## 2023-05-29 DIAGNOSIS — E782 Mixed hyperlipidemia: Secondary | ICD-10-CM

## 2023-05-29 DIAGNOSIS — I340 Nonrheumatic mitral (valve) insufficiency: Secondary | ICD-10-CM | POA: Diagnosis not present

## 2023-05-29 DIAGNOSIS — I361 Nonrheumatic tricuspid (valve) insufficiency: Secondary | ICD-10-CM

## 2023-05-29 DIAGNOSIS — Z955 Presence of coronary angioplasty implant and graft: Secondary | ICD-10-CM

## 2023-05-29 DIAGNOSIS — I10 Essential (primary) hypertension: Secondary | ICD-10-CM

## 2023-06-01 ENCOUNTER — Ambulatory Visit (INDEPENDENT_AMBULATORY_CARE_PROVIDER_SITE_OTHER)

## 2023-06-01 DIAGNOSIS — Z955 Presence of coronary angioplasty implant and graft: Secondary | ICD-10-CM | POA: Diagnosis not present

## 2023-06-01 DIAGNOSIS — E782 Mixed hyperlipidemia: Secondary | ICD-10-CM

## 2023-06-01 DIAGNOSIS — I10 Essential (primary) hypertension: Secondary | ICD-10-CM

## 2023-06-01 DIAGNOSIS — I2510 Atherosclerotic heart disease of native coronary artery without angina pectoris: Secondary | ICD-10-CM | POA: Diagnosis not present

## 2023-06-01 DIAGNOSIS — E119 Type 2 diabetes mellitus without complications: Secondary | ICD-10-CM

## 2023-06-07 ENCOUNTER — Other Ambulatory Visit: Payer: Self-pay | Admitting: Cardiovascular Disease

## 2023-06-07 DIAGNOSIS — E782 Mixed hyperlipidemia: Secondary | ICD-10-CM

## 2023-06-11 ENCOUNTER — Encounter

## 2023-06-16 ENCOUNTER — Encounter: Payer: Self-pay | Admitting: Cardiovascular Disease

## 2023-06-16 ENCOUNTER — Ambulatory Visit (INDEPENDENT_AMBULATORY_CARE_PROVIDER_SITE_OTHER): Admitting: Cardiovascular Disease

## 2023-06-16 VITALS — BP 130/72 | HR 68 | Ht 61.0 in | Wt 140.6 lb

## 2023-06-16 DIAGNOSIS — E782 Mixed hyperlipidemia: Secondary | ICD-10-CM

## 2023-06-16 DIAGNOSIS — I2510 Atherosclerotic heart disease of native coronary artery without angina pectoris: Secondary | ICD-10-CM

## 2023-06-16 DIAGNOSIS — Z955 Presence of coronary angioplasty implant and graft: Secondary | ICD-10-CM

## 2023-06-16 DIAGNOSIS — I10 Essential (primary) hypertension: Secondary | ICD-10-CM | POA: Diagnosis not present

## 2023-06-16 DIAGNOSIS — E119 Type 2 diabetes mellitus without complications: Secondary | ICD-10-CM

## 2023-06-16 NOTE — Progress Notes (Signed)
 Cardiology Office Note   Date:  06/16/2023   ID:  Jenna Boyer, DOB June 28, 1965, MRN [id number]  PCP:  Jenna Hove, MD  Cardiologist:  Debborah Fairly, MD      History of Present Illness: Jenna Boyer is a 58 y.o. female who presents for  Chief Complaint  Patient presents with   Follow-up    Results ECHO/NST    Patient denies any chest pain or shortness of breath.      Past Medical History:  Diagnosis Date   CAD (coronary artery disease)    CHF (congestive heart failure) (HCC)    Depression    Diabetes mellitus without complication (HCC)    GERD (gastroesophageal reflux disease)    Hyperlipemia    Hypertension    Myocardial infarction [facility])      Past Surgical History:  Procedure Laterality Date   ABDOMINAL HYSTERECTOMY     APPENDECTOMY     COLONOSCOPY N/A 02/08/2021   Procedure: COLONOSCOPY;  Surgeon: Shane Darling, MD;  Location: [facility]OS[facility];  Service: Endoscopy;  Laterality: N/A;  DM, BLOOD THINNER   CORONARY ARTERY BYPASS GRAFT     CORONARY ULTRASOUND/IVUS N/A 01/25/2017   Procedure: Intravascular Ultrasound/IVUS;  Surgeon: Sammy Crisp, MD;  Location: [facility];  Service: Cardiovascular;  Laterality: N/A;   CORONARY/GRAFT ACUTE MI REVASCULARIZATION N/A 01/25/2017   Procedure: Coronary/Graft Acute MI Revascularization;  Surgeon: Sammy Crisp, MD;  Location: [facility];  Service: Cardiovascular;  Laterality: N/A;   LEFT HEART CATH AND CORONARY ANGIOGRAPHY N/A 01/25/2017   Procedure: LEFT HEART CATH AND CORONARY ANGIOGRAPHY;  Surgeon: Sammy Crisp, MD;  Location: [facility];  Service: Cardiovascular;  Laterality: N/A;   MYOMECTOMY     OVARIAN CYST REMOVAL       Current Outpatient Medications  Medication Sig Dispense Refill   aspirin  EC 81 MG tablet Take 81 mg by mouth daily.     cholecalciferol (VITAMIN D3) 25 MCG (1000 UNIT) tablet Take 1,000 Units by mouth daily.     clopidogrel  (PLAVIX ) 75 MG tablet  Take 1 tablet (75 mg total) by mouth daily. 30 tablet 11   ezetimibe  (ZETIA ) 10 MG tablet TAKE 1 TABLET(10 MG) BY MOUTH DAILY 90 tablet 1   fexofenadine (ALLEGRA) 180 MG tablet Take 180 mg by mouth daily.     glimepiride (AMARYL) 2 MG tablet Take 4 mg by mouth 2 (two) times daily.   6   losartan  (COZAAR ) 50 MG tablet TAKE 1 TABLET BY MOUTH EVERY DAY 90 tablet 3   magnesium  gluconate (MAGONATE) 500 MG tablet Take 500 mg by mouth 2 (two) times daily.     metFORMIN (GLUCOPHAGE) 500 MG tablet Take 500 mg by mouth in the morning, at noon, and at bedtime.     metoprolol  succinate (TOPROL -XL) 25 MG 24 hr tablet TAKE 1 TABLET BY MOUTH EVERY DAY 90 tablet 3   pantoprazole  (PROTONIX ) 40 MG tablet Take 40 mg by mouth daily.  0   rosuvastatin  (CRESTOR ) 40 MG tablet TAKE 1 TABLET BY MOUTH EVERY DAY 90 tablet 3   No current facility-administered medications for this visit.    Allergies:   Enalapril  maleate    Social History:   reports that she has never smoked. She has never used smokeless tobacco. She reports that she does not drink alcohol and does not use drugs.   Family History:  family history includes Breast cancer in her maternal grandmother.    ROS:  Review of Systems  Constitutional: Negative.   HENT: Negative.    Eyes: Negative.   Respiratory: Negative.    Gastrointestinal: Negative.   Genitourinary: Negative.   Musculoskeletal: Negative.   Skin: Negative.   Neurological: Negative.   Endo/Heme/Allergies: Negative.   Psychiatric/Behavioral: Negative.    All other systems reviewed and are negative.     All other systems are reviewed and negative.    PHYSICAL EXAM: VS:  BP 130/72   Pulse 68   Ht 5\' 1"  (1.549 m)   Wt 140 lb 9.6 oz (63.8 kg)   SpO2 98%   BMI 26.57 kg/m  , BMI Body mass index is 26.57 kg/m. Last weight:  Wt Readings from Last 3 Encounters:  06/16/23 140 lb 9.6 oz (63.8 kg)  05/26/23 139 lb 9.6 oz (63.3 kg)  11/25/22 139 lb 6.4 oz (63.2 kg)      Physical Exam Constitutional:      Appearance: Normal appearance.  Cardiovascular:     Rate and Rhythm: Normal rate and regular rhythm.     Heart sounds: Normal heart sounds.  Pulmonary:     Effort: Pulmonary effort is normal.     Breath sounds: Normal breath sounds.  Musculoskeletal:     Right lower leg: No edema.     Left lower leg: No edema.  Neurological:     Mental Status: She is alert.       EKG:   Recent Labs: No results found for requested labs within last 365 days.    Lipid Panel    Component Value Date/Time   CHOL 151 01/27/2017 0348   TRIG 89 01/27/2017 0348   HDL 50 01/27/2017 0348   CHOLHDL 3.0 01/27/2017 0348   VLDL 18 01/27/2017 0348   LDLCALC 83 01/27/2017 0348      Other studies Reviewed: Additional studies/ records that were reviewed today include:  Review of the above records demonstrates:       No data to display            ASSESSMENT AND PLAN:    ICD-10-CM   1. Coronary stent patent  Z95.5    Stress test shows inferoapical fixed defects most likely due to prior MI in 2018 when patient had a STEMI.    2. Mixed hyperlipidemia  E78.2     3. Essential hypertension, benign  I10     4. Coronary artery disease involving native coronary artery of native heart without angina pectoris  I25.10    No chest pain or shortness of breath.    5. Type 2 diabetes mellitus without complication, without long-term current use of insulin  (HCC)  E11.9        Problem List Items Addressed This Visit       Cardiovascular and Mediastinum   Essential hypertension, benign   Coronary artery disease involving native coronary artery of native heart without angina pectoris     Endocrine   Type 2 diabetes mellitus without complication, without long-term current use of insulin  (HCC)     Other   Hyperlipidemia, unspecified   Other Visit Diagnoses       Coronary stent patent    -  Primary   Stress test shows inferoapical fixed defects most  likely due to prior MI in 2018 when patient had a STEMI.          Disposition:   Return in about 3 months (around 09/16/2023).    Total time spent: 30 minutes  Signed,  Debborah Fairly,  MD  06/16/2023 10:21 AM    [facility]

## 2023-08-07 ENCOUNTER — Ambulatory Visit: Payer: BC Managed Care – PPO | Admitting: Internal Medicine

## 2023-08-14 ENCOUNTER — Ambulatory Visit: Payer: Self-pay | Admitting: Internal Medicine

## 2023-08-14 ENCOUNTER — Encounter: Payer: Self-pay | Admitting: Internal Medicine

## 2023-08-14 ENCOUNTER — Ambulatory Visit: Admitting: Internal Medicine

## 2023-08-14 VITALS — BP 122/78 | HR 70 | Ht 61.0 in | Wt 138.8 lb

## 2023-08-14 DIAGNOSIS — E782 Mixed hyperlipidemia: Secondary | ICD-10-CM | POA: Diagnosis not present

## 2023-08-14 DIAGNOSIS — I2510 Atherosclerotic heart disease of native coronary artery without angina pectoris: Secondary | ICD-10-CM

## 2023-08-14 DIAGNOSIS — E119 Type 2 diabetes mellitus without complications: Secondary | ICD-10-CM

## 2023-08-14 DIAGNOSIS — E1169 Type 2 diabetes mellitus with other specified complication: Secondary | ICD-10-CM

## 2023-08-14 DIAGNOSIS — E1159 Type 2 diabetes mellitus with other circulatory complications: Secondary | ICD-10-CM

## 2023-08-14 DIAGNOSIS — I152 Hypertension secondary to endocrine disorders: Secondary | ICD-10-CM

## 2023-08-14 LAB — POCT CBG (FASTING - GLUCOSE)-MANUAL ENTRY: Glucose Fasting, POC: 267 mg/dL — AB (ref 70–99)

## 2023-08-14 LAB — POC CREATINE & ALBUMIN,URINE
Albumin/Creatinine Ratio, Urine, POC: <30
Creatinine, POC: 50 mg/dL
Microalbumin Ur, POC: 10 mg/L

## 2023-08-14 NOTE — Progress Notes (Signed)
 Established Patient Office Visit  Subjective:  Patient ID: Jenna Boyer, female    DOB: [birth year]  Age: 58 y.o. MRN: [id number]  Chief Complaint  Patient presents with   Follow-up    1 year follow up    Patient comes in for her follow-up today.  She is generally feeling well and has no new complaints.  She is under the care of cardiology and endocrinology.  Patient reports that her last hemoglobin A1c was high.  Admits to poor exercise regimen and dietary noncompliance.  However she is due for blood work and we will check it today.  Will also check her urine microalbumin.  Patient gets regular diabetic eye exams. Mammogram completed in 02/2023. Denies chest pain, no shortness of breath, no palpitations.    No other concerns at this time.   Past Medical History:  Diagnosis Date   CAD (coronary artery disease)    CHF (congestive heart failure) (HCC)    Depression    Diabetes mellitus without complication (HCC)    GERD (gastroesophageal reflux disease)    Hyperlipemia    Hypertension    Myocardial infarction [facility])     Past Surgical History:  Procedure Laterality Date   ABDOMINAL HYSTERECTOMY     APPENDECTOMY     COLONOSCOPY N/A 02/08/2021   Procedure: COLONOSCOPY;  Surgeon: Maryruth Ole DASEN, MD;  Location: [facility] ENDOSCOPY;  Service: Endoscopy;  Laterality: N/A;  DM, BLOOD THINNER   CORONARY ARTERY BYPASS GRAFT     CORONARY ULTRASOUND/IVUS N/A 01/25/2017   Procedure: Intravascular Ultrasound/IVUS;  Surgeon: Mady Bruckner, MD;  Location: [facility] INVASIVE CV LAB;  Service: Cardiovascular;  Laterality: N/A;   CORONARY/GRAFT ACUTE MI REVASCULARIZATION N/A 01/25/2017   Procedure: Coronary/Graft Acute MI Revascularization;  Surgeon: Mady Bruckner, MD;  Location: [facility] INVASIVE CV LAB;  Service: Cardiovascular;  Laterality: N/A;   LEFT HEART CATH AND CORONARY ANGIOGRAPHY N/A 01/25/2017   Procedure: LEFT HEART CATH AND CORONARY ANGIOGRAPHY;  Surgeon: Mady Bruckner, MD;   Location: [facility] INVASIVE CV LAB;  Service: Cardiovascular;  Laterality: N/A;   MYOMECTOMY     OVARIAN CYST REMOVAL      Social History   Socioeconomic History   Marital status: Widowed    Spouse name: Not on file   Number of children: Not on file   Years of education: Not on file   Highest education level: Not on file  Occupational History   Not on file  Tobacco Use   Smoking status: Never   Smokeless tobacco: Never  Vaping Use   Vaping status: Never Used  Substance and Sexual Activity   Alcohol use: No   Drug use: No   Sexual activity: Not on file  Other Topics Concern   Not on file  Social History Narrative   Not on file   Social Drivers of Health   Financial Resource Strain: Not on file  Food Insecurity: Not on file  Transportation Needs: Not on file  Physical Activity: Not on file  Stress: Not on file  Social Connections: Not on file  Intimate Partner Violence: Not on file    Family History  Problem Relation Age of Onset   Breast cancer Maternal Grandmother     Allergies  Allergen Reactions   Enalapril  Maleate Itching    Outpatient Medications Prior to Visit  Medication Sig   aspirin  EC 81 MG tablet Take 81 mg by mouth daily.   cholecalciferol (VITAMIN D3) 25 MCG (1000 UNIT) tablet Take 1,000 Units by mouth daily.  clopidogrel  (PLAVIX ) 75 MG tablet Take 1 tablet (75 mg total) by mouth daily.   ezetimibe  (ZETIA ) 10 MG tablet TAKE 1 TABLET(10 MG) BY MOUTH DAILY   fexofenadine (ALLEGRA) 180 MG tablet Take 180 mg by mouth daily.   glimepiride (AMARYL) 2 MG tablet Take 4 mg by mouth 2 (two) times daily.    losartan  (COZAAR ) 50 MG tablet TAKE 1 TABLET BY MOUTH EVERY DAY   magnesium  gluconate (MAGONATE) 500 MG tablet Take 500 mg by mouth 2 (two) times daily.   metFORMIN (GLUCOPHAGE) 500 MG tablet Take 500 mg by mouth in the morning, at noon, and at bedtime.   metoprolol  succinate (TOPROL -XL) 25 MG 24 hr tablet TAKE 1 TABLET BY MOUTH EVERY DAY   pantoprazole   (PROTONIX ) 40 MG tablet Take 40 mg by mouth daily.   rosuvastatin  (CRESTOR ) 40 MG tablet TAKE 1 TABLET BY MOUTH EVERY DAY   No facility-administered medications prior to visit.    Review of Systems  Constitutional: Negative.   HENT: Negative.    Eyes: Negative.   Respiratory: Negative.  Negative for cough and shortness of breath.   Cardiovascular: Negative.  Negative for chest pain, palpitations and leg swelling.  Gastrointestinal: Negative.  Negative for abdominal pain, constipation, diarrhea, heartburn, nausea and vomiting.  Genitourinary: Negative.  Negative for dysuria and flank pain.  Musculoskeletal: Negative.  Negative for joint pain and myalgias.  Skin: Negative.   Neurological: Negative.  Negative for dizziness and headaches.  Endo/Heme/Allergies: Negative.   Psychiatric/Behavioral: Negative.  Negative for depression and suicidal ideas. The patient is not nervous/anxious.        Objective:   BP 122/78   Pulse 70   Ht 5' 1 (1.549 m)   Wt 138 lb 12.8 oz (63 kg)   SpO2 99%   BMI 26.23 kg/m   Vitals:   08/14/23 0906  BP: 122/78  Pulse: 70  Height: 5' 1 (1.549 m)  Weight: 138 lb 12.8 oz (63 kg)  SpO2: 99%  BMI (Calculated): 26.24    Physical Exam Vitals and nursing note reviewed.  Constitutional:      Appearance: Normal appearance.  HENT:     Head: Normocephalic and atraumatic.     Nose: Nose normal.     Mouth/Throat:     Mouth: Mucous membranes are moist.     Pharynx: Oropharynx is clear.  Eyes:     Conjunctiva/sclera: Conjunctivae normal.     Pupils: Pupils are equal, round, and reactive to light.  Cardiovascular:     Rate and Rhythm: Normal rate and regular rhythm.     Pulses: Normal pulses.     Heart sounds: Normal heart sounds. No murmur heard. Pulmonary:     Effort: Pulmonary effort is normal.     Breath sounds: Normal breath sounds. No wheezing.  Abdominal:     General: Bowel sounds are normal.     Palpations: Abdomen is soft.      Tenderness: There is no abdominal tenderness. There is no right CVA tenderness or left CVA tenderness.  Musculoskeletal:        General: Normal range of motion.     Cervical back: Normal range of motion.     Right lower leg: No edema.     Left lower leg: No edema.  Skin:    General: Skin is warm and dry.  Neurological:     General: No focal deficit present.     Mental Status: She is alert and oriented to person, place, and  time.  Psychiatric:        Mood and Affect: Mood normal.        Behavior: Behavior normal.      Results for orders placed or performed in visit on 08/14/23  POCT CBG (Fasting - Glucose)  Result Value Ref Range   Glucose Fasting, POC 267 (A) 70 - 99 mg/dL  POC CREATINE & ALBUMIN,URINE  Result Value Ref Range   Microalbumin Ur, POC 10 mg/L   Creatinine, POC 50 mg/dL   Albumin/Creatinine Ratio, Urine, POC <30     Recent Results (from the past 2160 hours)  POCT CBG (Fasting - Glucose)     Status: Abnormal   Collection Time: 08/14/23  9:10 AM  Result Value Ref Range   Glucose Fasting, POC 267 (A) 70 - 99 mg/dL  POC CREATINE & ALBUMIN,URINE     Status: Normal   Collection Time: 08/14/23  9:28 AM  Result Value Ref Range   Microalbumin Ur, POC 10 mg/L   Creatinine, POC 50 mg/dL   Albumin/Creatinine Ratio, Urine, POC <30       Assessment & Plan:  Continue all medications.  Strict diet control and exercise emphasized. Problem List Items Addressed This Visit     Coronary artery disease involving native coronary artery of native heart without angina pectoris   Relevant Orders   CBC with Diff   Type 2 diabetes mellitus without complication, without long-term current use of insulin  (HCC)   Relevant Orders   POCT CBG (Fasting - Glucose) (Completed)   POC CREATINE & ALBUMIN,URINE (Completed)   Hemoglobin A1c   Combined hyperlipidemia associated with type 2 diabetes mellitus (HCC)   Relevant Orders   Lipid Panel w/o Chol/HDL Ratio   Hypertension associated  with diabetes (HCC) - Primary   Relevant Orders   CMP14+EGFR    Return in about 1 year (around 08/13/2024).   Total time spent: 30 minutes  FERNAND FREDY RAMAN, MD  08/14/2023   This document may have been prepared by [facility] Voice Recognition software and as such may include unintentional dictation errors.

## 2023-09-09 ENCOUNTER — Other Ambulatory Visit: Payer: Self-pay | Admitting: Cardiovascular Disease

## 2023-09-09 DIAGNOSIS — E782 Mixed hyperlipidemia: Secondary | ICD-10-CM

## 2023-09-17 ENCOUNTER — Ambulatory Visit: Admitting: Cardiovascular Disease

## 2023-12-11 ENCOUNTER — Other Ambulatory Visit: Payer: Self-pay | Admitting: Cardiology

## 2024-01-26 ENCOUNTER — Other Ambulatory Visit: Payer: Self-pay | Admitting: Internal Medicine

## 2024-02-24 ENCOUNTER — Other Ambulatory Visit: Payer: Self-pay | Admitting: Internal Medicine

## 2024-02-24 DIAGNOSIS — Z1231 Encounter for screening mammogram for malignant neoplasm of breast: Secondary | ICD-10-CM

## 2024-03-03 ENCOUNTER — Ambulatory Visit: Admitting: Cardiovascular Disease

## 2024-03-03 ENCOUNTER — Encounter: Payer: Self-pay | Admitting: Cardiovascular Disease

## 2024-03-03 VITALS — BP 114/76 | HR 78 | Ht 61.0 in | Wt 134.0 lb

## 2024-03-03 DIAGNOSIS — I2510 Atherosclerotic heart disease of native coronary artery without angina pectoris: Secondary | ICD-10-CM | POA: Diagnosis not present

## 2024-03-03 DIAGNOSIS — E1169 Type 2 diabetes mellitus with other specified complication: Secondary | ICD-10-CM | POA: Diagnosis not present

## 2024-03-03 DIAGNOSIS — E1159 Type 2 diabetes mellitus with other circulatory complications: Secondary | ICD-10-CM | POA: Diagnosis not present

## 2024-03-03 DIAGNOSIS — E1165 Type 2 diabetes mellitus with hyperglycemia: Secondary | ICD-10-CM | POA: Diagnosis not present

## 2024-03-03 DIAGNOSIS — I10 Essential (primary) hypertension: Secondary | ICD-10-CM

## 2024-03-03 DIAGNOSIS — R002 Palpitations: Secondary | ICD-10-CM

## 2024-03-03 DIAGNOSIS — E119 Type 2 diabetes mellitus without complications: Secondary | ICD-10-CM

## 2024-03-03 DIAGNOSIS — I152 Hypertension secondary to endocrine disorders: Secondary | ICD-10-CM | POA: Diagnosis not present

## 2024-03-03 DIAGNOSIS — E782 Mixed hyperlipidemia: Secondary | ICD-10-CM

## 2024-03-03 DIAGNOSIS — Z955 Presence of coronary angioplasty implant and graft: Secondary | ICD-10-CM | POA: Diagnosis not present

## 2024-03-03 MED ORDER — METOPROLOL SUCCINATE ER 50 MG PO TB24: 50.0000 mg | ORAL_TABLET | Freq: Every day | ORAL | 11 refills | Status: AC

## 2024-03-03 NOTE — Progress Notes (Signed)
 "     Cardiology Office Note   Date:  03/03/2024   ID:  Jenna Boyer, DOB October 29, 1965, MRN [id number]  PCP:  Fernand Fredy RAMAN, MD  Cardiologist:  Denyse Fernand, MD      History of Present Illness: Jenna Boyer is a 59 y.o. female who presents for  Chief Complaint  Patient presents with   Acute Visit    Heart flutters  Started back exercising 6 days a week cycling One she sits down and eats after or about an hour everything intensifies and the stutter gets stronger last about 45 min No SOB & no Pain just very uncomfortable     Has lots of flutter, and feeling SOB after flutter after exercise.      Past Medical History:  Diagnosis Date   CAD (coronary artery disease)    CHF (congestive heart failure) (HCC)    Depression    Diabetes mellitus without complication (HCC)    GERD (gastroesophageal reflux disease)    Hyperlipemia    Hypertension    Myocardial infarction [facility])      Past Surgical History:  Procedure Laterality Date   ABDOMINAL HYSTERECTOMY     APPENDECTOMY     COLONOSCOPY N/A 02/08/2021   Procedure: COLONOSCOPY;  Surgeon: Maryruth Ole DASEN, MD;  Location: [facility] ENDOSCOPY;  Service: Endoscopy;  Laterality: N/A;  DM, BLOOD THINNER   CORONARY ARTERY BYPASS GRAFT     CORONARY ULTRASOUND/IVUS N/A 01/25/2017   Procedure: Intravascular Ultrasound/IVUS;  Surgeon: Mady Bruckner, MD;  Location: [facility] INVASIVE CV LAB;  Service: Cardiovascular;  Laterality: N/A;   CORONARY/GRAFT ACUTE MI REVASCULARIZATION N/A 01/25/2017   Procedure: Coronary/Graft Acute MI Revascularization;  Surgeon: Mady Bruckner, MD;  Location: [facility] INVASIVE CV LAB;  Service: Cardiovascular;  Laterality: N/A;   LEFT HEART CATH AND CORONARY ANGIOGRAPHY N/A 01/25/2017   Procedure: LEFT HEART CATH AND CORONARY ANGIOGRAPHY;  Surgeon: Mady Bruckner, MD;  Location: [facility] INVASIVE CV LAB;  Service: Cardiovascular;  Laterality: N/A;   MYOMECTOMY     OVARIAN CYST REMOVAL       Current Outpatient  Medications  Medication Sig Dispense Refill   aspirin  EC 81 MG tablet Take 81 mg by mouth daily.     clopidogrel  (PLAVIX ) 75 MG tablet Take 1 tablet (75 mg total) by mouth daily. 30 tablet 11   ezetimibe  (ZETIA ) 10 MG tablet TAKE 1 TABLET(10 MG) BY MOUTH DAILY 90 tablet 1   fexofenadine (ALLEGRA) 180 MG tablet Take 180 mg by mouth daily.     glimepiride (AMARYL) 2 MG tablet Take 4 mg by mouth 2 (two) times daily.   6   losartan  (COZAAR ) 50 MG tablet TAKE 1 TABLET BY MOUTH EVERY DAY 90 tablet 3   metFORMIN (GLUCOPHAGE) 500 MG tablet Take 500 mg by mouth in the morning, at noon, and at bedtime.     metoprolol  succinate (TOPROL  XL) 50 MG 24 hr tablet Take 1 tablet (50 mg total) by mouth daily. Take with or immediately following a meal. 30 tablet 11   pantoprazole  (PROTONIX ) 40 MG tablet Take 40 mg by mouth daily.  0   rosuvastatin  (CRESTOR ) 40 MG tablet TAKE 1 TABLET(40 MG) BY MOUTH DAILY 90 tablet 3   No current facility-administered medications for this visit.    Allergies:   Enalapril  maleate    Social History:   reports that she has never smoked. She has never used smokeless tobacco. She reports that she does not drink alcohol and does not use drugs.  Family History:  family history includes Breast cancer in her maternal grandmother.    ROS:     Review of Systems  Constitutional: Negative.   HENT: Negative.    Eyes: Negative.   Respiratory: Negative.    Gastrointestinal: Negative.   Genitourinary: Negative.   Musculoskeletal: Negative.   Skin: Negative.   Neurological: Negative.   Endo/Heme/Allergies: Negative.   Psychiatric/Behavioral: Negative.    All other systems reviewed and are negative.     All other systems are reviewed and negative.    PHYSICAL EXAM: VS:  BP 114/76   Pulse 78   Ht 5' 1 (1.549 m)   Wt 134 lb (60.8 kg)   SpO2 96%   BMI 25.32 kg/m  , BMI Body mass index is 25.32 kg/m. Last weight:  Wt Readings from Last 3 Encounters:  03/03/24 134 lb  (60.8 kg)  08/14/23 138 lb 12.8 oz (63 kg)  06/16/23 140 lb 9.6 oz (63.8 kg)     Physical Exam Constitutional:      Appearance: Normal appearance.  Cardiovascular:     Rate and Rhythm: Normal rate and regular rhythm.     Heart sounds: Normal heart sounds.  Pulmonary:     Effort: Pulmonary effort is normal.     Breath sounds: Normal breath sounds.  Musculoskeletal:     Right lower leg: No edema.     Left lower leg: No edema.  Neurological:     Mental Status: She is alert.       EKG:   Recent Labs: No results found for requested labs within last 365 days.    Lipid Panel    Component Value Date/Time   CHOL 151 01/27/2017 0348   TRIG 89 01/27/2017 0348   HDL 50 01/27/2017 0348   CHOLHDL 3.0 01/27/2017 0348   VLDL 18 01/27/2017 0348   LDLCALC 83 01/27/2017 0348      Other studies Reviewed: Additional studies/ records that were reviewed today include:  Review of the above records demonstrates:       No data to display            ASSESSMENT AND PLAN:    ICD-10-CM   1. Palpitation  R00.2 metoprolol  succinate (TOPROL  XL) 50 MG 24 hr tablet   Increase metorolol to 50 daily, do not change to plavix  causing symptoms.    2. Coronary artery disease involving native coronary artery of native heart without angina pectoris  I25.10 metoprolol  succinate (TOPROL  XL) 50 MG 24 hr tablet    3. Type 2 diabetes mellitus without complication, without long-term current use of insulin  (HCC)  E11.9 metoprolol  succinate (TOPROL  XL) 50 MG 24 hr tablet    4. Mixed hyperlipidemia  E78.2 metoprolol  succinate (TOPROL  XL) 50 MG 24 hr tablet    5. Combined hyperlipidemia associated with type 2 diabetes mellitus (HCC)  E11.69 metoprolol  succinate (TOPROL  XL) 50 MG 24 hr tablet   E78.2     6. Hypertension associated with diabetes (HCC)  E11.59 metoprolol  succinate (TOPROL  XL) 50 MG 24 hr tablet   I15.2     7. Essential hypertension, benign  I10 metoprolol  succinate (TOPROL  XL) 50 MG  24 hr tablet    8. Coronary stent patent  Z95.5 metoprolol  succinate (TOPROL  XL) 50 MG 24 hr tablet    9. Primary hypertension  I10 metoprolol  succinate (TOPROL  XL) 50 MG 24 hr tablet       Problem List Items Addressed This Visit  Cardiovascular and Mediastinum   Essential hypertension, benign   Relevant Medications   metoprolol  succinate (TOPROL  XL) 50 MG 24 hr tablet   Coronary artery disease involving native coronary artery of native heart without angina pectoris   Relevant Medications   metoprolol  succinate (TOPROL  XL) 50 MG 24 hr tablet   Hypertension associated with diabetes (HCC)   Relevant Medications   metoprolol  succinate (TOPROL  XL) 50 MG 24 hr tablet     Endocrine   Type 2 diabetes mellitus without complication, without long-term current use of insulin  (HCC)   Relevant Medications   metoprolol  succinate (TOPROL  XL) 50 MG 24 hr tablet   Combined hyperlipidemia associated with type 2 diabetes mellitus (HCC)   Relevant Medications   metoprolol  succinate (TOPROL  XL) 50 MG 24 hr tablet     Other   Hyperlipidemia, unspecified   Relevant Medications   metoprolol  succinate (TOPROL  XL) 50 MG 24 hr tablet   Other Visit Diagnoses       Palpitation    -  Primary   Increase metorolol to 50 daily, do not change to plavix  causing symptoms.   Relevant Medications   metoprolol  succinate (TOPROL  XL) 50 MG 24 hr tablet     Coronary stent patent       Relevant Medications   metoprolol  succinate (TOPROL  XL) 50 MG 24 hr tablet     Primary hypertension       Relevant Medications   metoprolol  succinate (TOPROL  XL) 50 MG 24 hr tablet          Disposition:   Return in about 2 weeks (around 03/17/2024).    Total time spent: 30 minutes  Signed,  Denyse Bathe, MD  03/03/2024 2:06 PM    [facility] "

## 2024-03-17 ENCOUNTER — Encounter

## 2024-04-01 ENCOUNTER — Ambulatory Visit: Admitting: Cardiovascular Disease

## 2024-08-15 ENCOUNTER — Ambulatory Visit: Admitting: Internal Medicine
